# Patient Record
Sex: Male | Born: 1990 | Race: White | Hispanic: No | Marital: Single | Smoking: Current some day smoker
Health system: Southern US, Community
[De-identification: ages and names within clinical notes are randomized; demographics above are authoritative.]

## PROBLEM LIST (undated history)

## (undated) DIAGNOSIS — J45909 Unspecified asthma, uncomplicated: Secondary | ICD-10-CM

## (undated) DIAGNOSIS — G8929 Other chronic pain: Secondary | ICD-10-CM

## (undated) DIAGNOSIS — J309 Allergic rhinitis, unspecified: Secondary | ICD-10-CM

## (undated) DIAGNOSIS — G43909 Migraine, unspecified, not intractable, without status migrainosus: Secondary | ICD-10-CM

## (undated) DIAGNOSIS — M549 Dorsalgia, unspecified: Secondary | ICD-10-CM

## (undated) HISTORY — DX: Migraine, unspecified, not intractable, without status migrainosus: G43.909

## (undated) HISTORY — DX: Other chronic pain: G89.29

## (undated) HISTORY — DX: Dorsalgia, unspecified: M54.9

## (undated) HISTORY — DX: Unspecified asthma, uncomplicated: J45.909

## (undated) HISTORY — PX: TONSILLECTOMY: SHX5217

## (undated) HISTORY — DX: Allergic rhinitis, unspecified: J30.9

---
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
## 2012-01-20 ENCOUNTER — Encounter: Payer: Self-pay | Admitting: Internal Medicine

## 2012-01-27 ENCOUNTER — Ambulatory Visit (INDEPENDENT_AMBULATORY_CARE_PROVIDER_SITE_OTHER): Payer: BC Managed Care – PPO | Admitting: Internal Medicine

## 2012-01-27 ENCOUNTER — Encounter: Payer: Self-pay | Admitting: Internal Medicine

## 2012-01-27 VITALS — BP 122/72 | HR 77 | Temp 97.6°F | Ht 70.0 in | Wt 231.0 lb

## 2012-01-27 DIAGNOSIS — E669 Obesity, unspecified: Secondary | ICD-10-CM | POA: Insufficient documentation

## 2012-01-27 DIAGNOSIS — Z0000 Encounter for general adult medical examination without abnormal findings: Secondary | ICD-10-CM

## 2012-01-27 DIAGNOSIS — E663 Overweight: Secondary | ICD-10-CM

## 2012-01-27 DIAGNOSIS — Z23 Encounter for immunization: Secondary | ICD-10-CM

## 2012-01-27 DIAGNOSIS — M5432 Sciatica, left side: Secondary | ICD-10-CM | POA: Insufficient documentation

## 2012-01-27 DIAGNOSIS — M5430 Sciatica, unspecified side: Secondary | ICD-10-CM

## 2012-01-27 MED ORDER — IBUPROFEN 200 MG PO TABS: 400.0000 mg | ORAL_TABLET | Freq: Four times a day (QID) | ORAL | Status: AC | PRN

## 2012-01-27 MED ORDER — PREDNISONE (PAK) 10 MG PO TABS: 10.0000 mg | ORAL_TABLET | ORAL | Status: AC

## 2012-01-27 MED ORDER — CYCLOBENZAPRINE HCL 5 MG PO TABS: 5.0000 mg | ORAL_TABLET | Freq: Every evening | ORAL | Status: AC | PRN

## 2012-01-27 NOTE — Assessment & Plan Note (Signed)
Education on importance of diet and exercise to general health Wt Readings from Last 3 Encounters:  01/27/12 231 lb (104.781 kg) (98.26%*)  03/18/10 160 lb 12.8 oz (72.938 kg) (74.29%*)   * Growth percentiles are based on CDC 2-20 Years data.

## 2012-01-27 NOTE — Progress Notes (Signed)
  Subjective:    Patient ID: Omar French, male    DOB: 02-16-1991, 21 y.o.   MRN: [id number]  HPI patient is here today for annual physical. Patient feels well overall.  complains of back pain Ongoing > 2 years - ?yoga injury Radiates down LLE 2-3x/week Worse with sitting position, improved when supine not improved with prn OTC ibuprofen  Not associated with fever, weakness or bowel.bladder changes  Past Medical History  Diagnosis Date  . ALLERGIC RHINITIS   . ASTHMA   . MIGRAINE HEADACHE    Family History  Problem Relation Age of Onset  . Prostate cancer Maternal Grandfather   . Diabetes Other   . Hyperlipidemia Other    History  Substance Use Topics  . Smoking status: Never Smoker   . Smokeless tobacco: Not on file   Comment: Lives with mom- Attends [employer]  . Alcohol Use: No    Review of Systems Constitutional: Negative for fever or weight change.  Respiratory: Negative for cough and shortness of breath.   Cardiovascular: Negative for chest pain or palpitations.  Gastrointestinal: Negative for abdominal pain, no bowel changes.  Musculoskeletal: Negative for gait problem or joint swelling.  Skin: Negative for rash.  Neurological: Negative for dizziness or headache.  No other specific complaints in a complete review of systems (except as listed in HPI above).     Objective:   Physical Exam BP 122/72  Pulse 77  Temp(Src) 97.6 F (36.4 C) (Temporal)  Ht 5\' 10"  (1.778 m)  Wt 231 lb (104.781 kg)  BMI 33.15 kg/m2  SpO2 98% Wt Readings from Last 3 Encounters:  01/27/12 231 lb (104.781 kg) (98.26%*)  03/18/10 160 lb 12.8 oz (72.938 kg) (74.29%*)   * Growth percentiles are based on CDC 2-20 Years data.   Constitutional: he appears well-developed and well-nourished. No distress.  HENT: Head: Normocephalic and atraumatic. Ears: B TMs ok, no erythema or effusion; Nose: Nose normal. Mouth/Throat: Oropharynx is clear and moist. No oropharyngeal exudate.  Eyes:  Conjunctivae and EOM are normal. Pupils are equal, round, and reactive to light. No scleral icterus.  Neck: Normal range of motion. Neck supple. No JVD present. No thyromegaly present.  Cardiovascular: Normal rate, regular rhythm and normal heart sounds.  No murmur heard. No BLE edema. Pulmonary/Chest: Effort normal and breath sounds normal. No respiratory distress. no wheezes.  Abdominal: Soft. Bowel sounds are normal. no distension. There is no tenderness. no masses Musculoskeletal: Back: full range of motion of thoracic and lumbar spine. Non tender to palpation. Negative straight leg raise. DTR's are symmetrically intact. Sensation intact in all dermatomes of the lower extremities. Full strength to manual muscle testing. patient is able to heel toe walk without difficulty and ambulates with non-antalgic gait. Neurological: he is alert and oriented to person, place, and time. No cranial nerve deficit. Coordination normal.  Skin: Skin is warm and dry. No rash noted. No erythema.  Psychiatric: he has a normal mood and affect. behavior is normal. Judgment and thought content normal.   No results found for this basename: WBC, HGB, HCT, PLT, GLUCOSE, CHOL, TRIG, HDL, LDLDIRECT, LDLCALC, ALT, AST, NA, K, CL, CREATININE, BUN, CO2, TSH, PSA, INR, GLUF, HGBA1C, MICROALBUR      Assessment & Plan:  CPX /v70.0 - Patient has been counseled on age-appropriate routine health concerns for screening and prevention. These are reviewed and up-to-date. Immunizations are up-to-date or declined. Labs ordered and will be reviewed.

## 2012-01-27 NOTE — Assessment & Plan Note (Signed)
Intermittent symptoms x 2 years - exam benign today tx pred pak and muscle relaxer prn Back exercised and call if worse or unimproved

## 2012-01-27 NOTE — Patient Instructions (Signed)
It was good to see you today. Health Maintenance reviewed - 2nd Gardasil given today - all recommended immunizations and age-appropriate screenings are up-to-date. Test(s) ordered today. Your results will be called to you after review (48-72hours after test completion). If any changes need to be made, you will be notified at that time. For your back, 6day pred taper and flexeril muscle relaxer at night as eeded for spasm/pain - Your prescription(s) have been submitted to your pharmacy. Please take as directed and contact our office if you believe you are having problem(s) with the medication(s). Use ibuprofen for aches, pain and fever symptoms as discussed Work on lifestyle changes as discussed (low fat, low carb, increased protein diet; improved exercise efforts; weight loss) to control sugar, blood pressure and cholesterol levels and/or reduce risk of developing other medical problems. Look into [employer]cy or other type of food journal to assist you in this process. Back Exercises Back exercises help treat and prevent back injuries. The goal is to increase your strength in your belly (abdominal) and back muscles. These exercises can also help with flexibility. Start these exercises when told by your doctor. HOME CARE Back exercises include: Pelvic Tilt.  Lie on your back with your knees bent. Tilt your pelvis until the lower part of your back is against the floor. Hold this position 5 to 10 sec. Repeat this exercise 5 to 10 times.  Knee to Chest.  Pull 1 knee up against your chest and hold for 20 to 30 seconds. Repeat this with the other knee. This may be done with the other leg straight or bent, whichever feels better. Then, pull both knees up against your chest.  Sit-Ups or Curl-Ups.  Bend your knees 90 degrees. Start with tilting your pelvis, and do a partial, slow sit-up. Only lift your upper half 30 to 45 degrees off the floor. Take at least 2 to 3 seonds for each sit-up. Do not do  sit-ups with your knees out straight. If partial sit-ups are difficult, simply do the above but with only tightening your belly (abdominal) muscles and holding it as told.  Hip-Lift.  Lie on your back with your knees flexed 90 degrees. Push down with your feet and shoulders as you raise your hips 2 inches off the floor. Hold for 10 seconds, repeat 5 to 10 times.  Back Arches.  Lie on your stomach. Prop yourself up on bent elbows. Slowly press on your hands, causing an arch in your low back. Repeat 3 to 5 times.  Shoulder-Lifts.  Lie face down with arms beside your body. Keep hips and belly pressed to floor as you slowly lift your head and shoulders off the floor.  Do not overdo your exercises. Be careful in the beginning. Exercises may cause you some mild back discomfort. If the pain lasts for more than 15 minutes, stop the exercises until you see your doctor. Improvement with exercise for back problems is slow.   Document Released: 10/08/2010 Document Revised: 08/25/2011 Document Reviewed: 10/08/2010 [facility] Patient Information 2012 [place], [place].

## 2012-02-27 ENCOUNTER — Encounter: Payer: Self-pay | Admitting: Internal Medicine

## 2012-02-27 ENCOUNTER — Ambulatory Visit (INDEPENDENT_AMBULATORY_CARE_PROVIDER_SITE_OTHER): Payer: BC Managed Care – PPO | Admitting: Internal Medicine

## 2012-02-27 ENCOUNTER — Other Ambulatory Visit (INDEPENDENT_AMBULATORY_CARE_PROVIDER_SITE_OTHER): Payer: BC Managed Care – PPO

## 2012-02-27 VITALS — BP 110/68 | HR 62 | Temp 97.3°F | Ht 71.0 in | Wt 229.0 lb

## 2012-02-27 DIAGNOSIS — N419 Inflammatory disease of prostate, unspecified: Secondary | ICD-10-CM

## 2012-02-27 DIAGNOSIS — G8929 Other chronic pain: Secondary | ICD-10-CM

## 2012-02-27 DIAGNOSIS — R109 Unspecified abdominal pain: Secondary | ICD-10-CM

## 2012-02-27 DIAGNOSIS — R300 Dysuria: Secondary | ICD-10-CM

## 2012-02-27 DIAGNOSIS — N390 Urinary tract infection, site not specified: Secondary | ICD-10-CM

## 2012-02-27 LAB — CBC WITH DIFFERENTIAL/PLATELET
Basophils Absolute: 0 10*3/uL (ref 0.0–0.1)
Eosinophils Absolute: 0.3 10*3/uL (ref 0.0–0.7)
HCT: 44.7 % (ref 39.0–52.0)
Lymphs Abs: 2.6 10*3/uL (ref 0.7–4.0)
MCHC: 35 g/dL (ref 30.0–36.0)
MCV: 91.3 fl (ref 78.0–100.0)
Monocytes Absolute: 0.6 10*3/uL (ref 0.1–1.0)
Monocytes Relative: 7.4 % (ref 3.0–12.0)
Platelets: 239 10*3/uL (ref 150.0–400.0)
RDW: 12.9 % (ref 11.5–14.6)

## 2012-02-27 LAB — POCT URINALYSIS DIPSTICK
Blood, UA: NEGATIVE
Glucose, UA: NEGATIVE
Spec Grav, UA: <=1.005
Urobilinogen, UA: 0.2

## 2012-02-27 MED ORDER — SULFAMETHOXAZOLE-TRIMETHOPRIM 800-160 MG PO TABS: 1.0000 | ORAL_TABLET | Freq: Two times a day (BID) | ORAL | Status: AC

## 2012-02-27 NOTE — Progress Notes (Signed)
  Subjective:    Patient ID: Omar French, male    DOB: 1990-10-10, 21 y.o.   MRN: [id number]  HPI complains of trouble urinating Onset 2 weeks ago associated with difficulty urinating Denies penile discharge or fever  Also continued back pain, L flank Ongoing > 2 years - ?yoga injury Radiates down LLE 2-3x/week Worse with sitting position, improved when supine not improved with prn OTC ibuprofen  Brief improved with pred while on taper  Past Medical History  Diagnosis Date  . ALLERGIC RHINITIS   . ASTHMA   . MIGRAINE HEADACHE     Review of Systems  Constitutional: Negative for fever and fatigue.  Genitourinary: Positive for urgency, frequency, flank pain (left, low grade, chronic), decreased urine volume and difficulty urinating. Negative for hematuria, discharge, penile swelling, scrotal swelling, genital sores, penile pain and testicular pain.       Objective:   Physical Exam BP 110/68  Pulse 62  Temp(Src) 97.3 F (36.3 C) (Oral)  Ht 5\' 11"  (1.803 m)  Wt 229 lb (103.874 kg)  BMI 31.94 kg/m2  SpO2 98% Wt Readings from Last 3 Encounters:  02/27/12 229 lb (103.874 kg) (98.09%*)  01/27/12 231 lb (104.781 kg) (98.26%*)  03/18/10 160 lb 12.8 oz (72.938 kg) (74.29%*)   * Growth percentiles are based on CDC 2-20 Years data.   Constitutional: appears well-developed and well-nourished. No distress. Dad at side (until exchanged for chaperone) Cardiovascular: Normal rate, regular rhythm and normal heart sounds.  No murmur heard. No BLE edema. Pulmonary/Chest: Effort normal and breath sounds normal. No respiratory distress. he has no wheezes.  Abdominal: Soft, obese. Bowel sounds are normal. he exhibits no distension. There is no reproducible tenderness. no masses or suprapubic fullness GU: chaperoned by Randa Ngo CMA - normal testes B, circumcised penis without lesion or urethral discharge - prostate boggy and tender, slightly enlarged Musculoskeletal: Back: full range of  motion of thoracic and lumbar spine. Non tender to palpation. Negative straight leg raise. DTR's are symmetrically intact. Sensation intact in all dermatomes of the lower extremities. Full strength to manual muscle testing. patient is able to heel toe walk without difficulty and ambulates with antalgic gait. Skin: Skin is warm and dry. No rash noted. No erythema.  Psychiatric:  normal mood and affect. behavior is normal. Judgment and thought content normal.   No results found for this basename: WBC, HGB, HCT, PLT, GLUCOSE, CHOL, TRIG, HDL, LDLDIRECT, LDLCALC, ALT, AST, NA, K, CL, CREATININE, BUN, CO2, TSH, PSA, INR, GLUF, HGBA1C, MICROALBUR      Assessment & Plan:  Prostatitis symptoms - notable on hx and exam Bladder spasm chronic L flank pain  Check Ucx and GC/chlam urine today rule out cystitis or epidydimitis (none on exam) Also CBC and BMet tx 2 weeks septra - erx done Pt education provided Consider check CT a/p to look for anatomic problems that might explain pain and dysuria if unimproved on antibiotics or consider uro eval if all unremarkable

## 2012-02-27 NOTE — Patient Instructions (Addendum)
It was good to see you today. Prostatitis is the likely cause of your bladder symptoms - treat with 2 weeks Septra 2x/day -  Your prescription(s) have been submitted to your pharmacy. Please take as directed and contact our office if you believe you are having problem(s) with the medication(s). Test(s) ordered today. Your results will be called to you after review (48-72hours after test completion). If any changes need to be made, you will be notified at that time. Call for recheck if symptoms unimproved, sooner if worseProstatitis Prostatitis is an inflammation (the body's way of reacting to injury and/or infection) of the prostate gland. The prostate gland is a male organ. The gland is about the size and shape of a walnut. The prostate is located just below the bladder. It produces semen, which is a fluid that helps nourish and transport sperm. Prostatitis is the most common urinary tract problem in men younger than age 80. There are 4 categories of prostatitis:  I - Acute bacterial prostatitis.   II - Chronic bacterial prostatitis.   III - Chronic prostatitis and chronic pelvic pain syndrome (CPPS).   Inflammatory.   Non inflammatory.   IV - Asymptomatic inflammatory prostatitis.  Acute and chronic bacterial prostatitis are problems with bacterial infections of the prostate. "Acute" infection is usually a one-time problem. "Chronic" bacterial prostatitis is a condition with recurrent infection. It is usually caused by the same germ(bacteria). CPPS has symptoms similar to prostate infection. However, no infection is actually found. This condition can cause problems of ongoing pain. Currently, it cannot be cured. Treatments are available and aimed at symptom control.   Asymptomatic inflammatory prostatitis has no symptoms. It is a condition where infection-fighting cells are found by chance in the urine. The diagnosis is made most often during an exam for other conditions. Other conditions  could be infertility or a high level of PSA (prostate-specific antigen) in the blood. SYMPTOMS   Symptoms can vary depending upon the type of prostatitis that exists. There can also be overlap in symptoms. This can make diagnosis difficult. Symptoms: For Acute bacterial prostatitis  Painful urination.   Fever or chills.   Muscle or joint pains.   Low back pain.   Low abdominal pain.   Inability to empty bladder completely.   Sudden urges to urinate.   Frequent urination during the day.   Difficulty starting urine stream.   Need to urinate several times at night (nocturia).   Weak urine stream.   Urethral (tube that carries urine from the bladder out of the body) discharge and dribbling after urination.  For Chronic bacterial prostatitis  Rectal pain.   Pain in the testicles, penis, or tip of the penis.   Pain in the space between the anus and scrotum (perineum).   Low back pain.   Low abdominal pain.   Problems with sexual function.   Painful ejaculation.   Bloody semen.   Inability to empty bladder completely.   Painful urination.   Sudden urges to urinate.   Frequent urination during the day.   Difficulty starting urine stream.   Need to urinate several times at night (nocturia).   Weak urine stream.   Dribbling after urination.   Urethral discharge.  For Chronic prostatitis and chronic pelvic pain syndrome (CPPS) Symptoms are the same as those for chronic bacterial prostatitis. Problems with sexual function are often the reason for seeking care. This important problem should be discussed with your caregiver. For Asymptomatic inflammatory prostatitis As noted above,  there are no symptoms with this condition. DIAGNOSIS    Your caregiver may perform a rectal exam. This exam is to determine if the prostate is swollen and tender.   Sometimes blood work is performed. This is done to see if your white blood cell count is elevated. The Prostate  Specific Antigen (PSA) is also measured. PSA is a blood test that can help detect early prostate cancer.   A urinalysis is done to find out what type of infection is present if this is a suspected cause. An additional urinalysis may be done after a digital rectal exam. This is to see if white blood cells are pushed out of the prostate and into the urine. A low-grade infection of the prostate may not be found on the first urinalysis.  In more difficult cases, your caregiver may advise other tests. Tests could include:  Urodynamics -- Tests the function of the bladder and the organs involved in triggering and controlling normal urination.   Urine flow rate.   Cystoscopy -- In this procedure, a thin, telescope-like tube with a light and tiny camera attached (cystoscope) is inserted into the bladder through the urethra. This allows the caregiver to see the inside of the urethra and bladder.   Electromyography -- This procedure tests how the muscles and nerves of the bladder work. It is focused on the muscles that control the anus and pelvic floor. These are the muscles between the anus and scrotum.  In people who show no signs of infection, certain uncommon infections might be causing constant or recurrent symptoms. These uncommon infections are difficult to detect. More work in medicine may help find solutions to these problems. TREATMENT   Antibiotics are used to treat infections caused by germs. If the infection is not treated and becomes long lasting (chronic), it may become a lower grade infection with minor, continual problems. Without treatment, the prostate may develop a boil or furuncle (abscess). This may require surgical treatment. For those with chronic prostatitis and CPPS, it is important to work closely with your primary caregiver and urologist. For some, the medicines that are used to treat a non-cancerous, enlarged prostate (benign prostatic hypertrophy) may be helpful. Referrals to  specialists other than urologists may be necessary. In rare cases when all treatments have been inadequate for pain control, an operation to remove the prostate may be recommended. This is very rare and before this is considered thorough discussion with your urologist is highly recommended.   In cases of secondary to chronic non-bacterial prostatitis, a good relationship with your urologist or primary caregiver is essential because it is often a recurrent prolonged condition that requires a good understanding of the causes and a commitment to therapy aimed at controlling your symptoms. HOME CARE INSTRUCTIONS    Hot sitz baths for 20 minutes, 4 times per day, may help relieve pain.   Non-prescription pain killers may be used as your caregiver recommends if you have no allergies to them. Some illnesses or conditions prevent use of non-prescription drugs. If unsure, check with your caregiver. Take all medications as directed. Take the antibiotics for the prescribed length of time, even if you are feeling better.  SEEK MEDICAL CARE IF:    You have any worsening of the symptoms that originally brought you to your caregiver.   You have an oral temperature above 102 F (38.9 C).   You experience any side effects from medications prescribed.  SEEK IMMEDIATE MEDICAL CARE IF:    You have  an oral temperature above 102 F (38.9 C), not controlled by medicine.   You have pain not relieved with medications.   You develop nausea, vomiting, lightheadedness, or have a fainting episode.   You are unable to urinate.   You pass bloody urine or clots.  Document Released: 09/02/2000 Document Revised: 08/25/2011 Document Reviewed: 08/08/2011 [facility] Patient Information 2012 [place], [place].

## 2012-02-28 LAB — BASIC METABOLIC PANEL
BUN: 9 mg/dL (ref 6–23)
CO2: 27 mEq/L (ref 19–32)
GFR: 136.11 mL/min (ref >60.00)
Glucose, Bld: 93 mg/dL (ref 70–99)
Potassium: 4.3 mEq/L (ref 3.5–5.1)

## 2012-02-28 LAB — GC PROBE AMPLIFICATION, URINE: GC Probe Amp, Urine: NEGATIVE

## 2012-03-01 ENCOUNTER — Encounter: Payer: Self-pay | Admitting: *Deleted

## 2012-04-17 ENCOUNTER — Ambulatory Visit (INDEPENDENT_AMBULATORY_CARE_PROVIDER_SITE_OTHER): Payer: BC Managed Care – PPO

## 2012-04-17 DIAGNOSIS — Z23 Encounter for immunization: Secondary | ICD-10-CM

## 2012-10-11 ENCOUNTER — Ambulatory Visit (INDEPENDENT_AMBULATORY_CARE_PROVIDER_SITE_OTHER)
Admission: RE | Admit: 2012-10-11 | Discharge: 2012-10-11 | Disposition: A | Discharge status: Home / Self Care | Payer: 59 | Source: Ambulatory Visit | Attending: Internal Medicine | Admitting: Internal Medicine

## 2012-10-11 ENCOUNTER — Other Ambulatory Visit (INDEPENDENT_AMBULATORY_CARE_PROVIDER_SITE_OTHER): Payer: 59

## 2012-10-11 ENCOUNTER — Encounter: Payer: Self-pay | Admitting: *Deleted

## 2012-10-11 ENCOUNTER — Ambulatory Visit (INDEPENDENT_AMBULATORY_CARE_PROVIDER_SITE_OTHER): Payer: 59 | Admitting: Internal Medicine

## 2012-10-11 ENCOUNTER — Encounter: Payer: Self-pay | Admitting: Internal Medicine

## 2012-10-11 VITALS — BP 112/80 | HR 73 | Temp 97.6°F | Ht 71.0 in | Wt 207.8 lb

## 2012-10-11 DIAGNOSIS — M545 Low back pain, unspecified: Secondary | ICD-10-CM

## 2012-10-11 DIAGNOSIS — R3911 Hesitancy of micturition: Secondary | ICD-10-CM

## 2012-10-11 LAB — URINALYSIS
Ketones, ur: NEGATIVE
Specific Gravity, Urine: 1.015 (ref 1.000–1.030)
Urine Glucose: NEGATIVE
Urobilinogen, UA: 1 (ref 0.0–1.0)
pH: 8 (ref 5.0–8.0)

## 2012-10-11 MED ORDER — METHYLPREDNISOLONE ACETATE 80 MG/ML IJ SUSP
80.0000 mg | Freq: Once | INTRAMUSCULAR | Status: AC
  Administered 2012-10-11: 80 mg via INTRAMUSCULAR

## 2012-10-11 NOTE — Patient Instructions (Addendum)

## 2012-10-12 ENCOUNTER — Other Ambulatory Visit: Payer: Self-pay | Admitting: Internal Medicine

## 2012-10-12 ENCOUNTER — Encounter: Payer: Self-pay | Admitting: Internal Medicine

## 2012-10-12 ENCOUNTER — Telehealth: Payer: Self-pay | Admitting: Internal Medicine

## 2012-10-12 DIAGNOSIS — M545 Low back pain: Secondary | ICD-10-CM

## 2012-10-12 DIAGNOSIS — M5432 Sciatica, left side: Secondary | ICD-10-CM

## 2012-10-12 LAB — GC/CHLAMYDIA PROBE AMP, URINE
Chlamydia, Swab/Urine, PCR: NEGATIVE
GC Probe Amp, Urine: NEGATIVE

## 2012-10-12 NOTE — Progress Notes (Signed)
Subjective:    Patient ID: Omar French, male    DOB: 09-24-1990, 22 y.o.   MRN: [id number]  HPI  Pt presents to the clinic today with c/o back pain. This is an ongoing problem for him. He did have a back injury a few years ago and since that time has c/o intermittent lower back pain with sciatica into the left leg. He has been on pred tapers in the past which has helped his back pain but as soon as he comes of the medication, the pain returns. Additionally, he does have some concerns about an abnormal feeling in his abdomen. It feels full like he needs to urinate but it unable to go. He has had problems with prostatitis in the past but he states this does not feel the same. He has no pain with urination or burning sensation. He denies penile discharge and he has no concerns about STD's. He denies fever, chills or body aches.  Review of Systems      Past Medical History  Diagnosis Date  . ALLERGIC RHINITIS   . ASTHMA   . MIGRAINE HEADACHE     Current Outpatient Prescriptions  Medication Sig Dispense Refill  . albuterol (PROAIR HFA) 108 (90 BASE) MCG/ACT inhaler Inhale 2 puffs into the lungs every 4 (four) hours as needed.      . cetirizine (ZYRTEC) 10 MG tablet Take 10 mg by mouth daily.        Allergies  Allergen Reactions  . Amoxicillin     REACTION: Rash, Itching  . Penicillins     Family History  Problem Relation Age of Onset  . Prostate cancer Maternal Grandfather   . Diabetes Other   . Hyperlipidemia Other     History   Social History  . Marital Status: Single    Spouse Name: N/A    Number of Children: N/A  . Years of Education: N/A   Occupational History  . Not on file.   Social History Main Topics  . Smoking status: Current Some Day Smoker  . Smokeless tobacco: Not on file     Comment: Lives with mom- Attends [employer]  . Alcohol Use: No  . Drug Use: No  . Sexually Active: Not Currently     Comment: Gay, not currently in relationship- parents  are aware of pt relationships   Other Topics Concern  . Not on file   Social History Narrative  . No narrative on file     Constitutional: Denies fever, malaise, fatigue, headache or abrupt weight changes.  Gastrointestinal: Denies abdominal pain, bloating, constipation, diarrhea or blood in the stool.  GU: Pt reports hesitancy. Denies urgency, frequency, pain with urination, burning sensation, blood in urine, odor or discharge. Musculoskeletal: Pt reports back pain. Denies decrease in range of motion, difficulty with gait, or joint pain and swelling.  Skin: Denies redness, rashes, lesions or ulcercations.  Neurological: Pt reports sharp shooting pains down into his left leg. Denies dizziness, difficulty with memory, difficulty with speech or problems with balance and coordination.   No other specific complaints in a complete review of systems (except as listed in HPI above).  Objective:   Physical Exam  BP 112/80  Pulse 73  Temp 97.6 F (36.4 C) (Oral)  Ht 5\' 11"  (1.803 m)  Wt 207 lb 12 oz (94.235 kg)  BMI 28.98 kg/m2  SpO2 97% Wt Readings from Last 3 Encounters:  10/11/12 207 lb 12 oz (94.235 kg) (94.50%*)  02/27/12 229 lb (  103.874 kg) (98.09%*)  01/27/12 231 lb (104.781 kg) (98.26%*)   * Growth percentiles are based on CDC 2-20 Years data.    General: Appears his stated age, well developed, well nourished in NAD. Cardiovascular: Normal rate and rhythm. S1,S2 noted.  No murmur, rubs or gallops noted. No JVD or BLE edema. No carotid bruits noted. Pulmonary/Chest: Normal effort and positive vesicular breath sounds. No respiratory distress. No wheezes, rales or ronchi noted.  Abdomen: Soft and nontender. Normal bowel sounds, no bruits noted. No distention or masses noted. Liver, spleen and kidneys non palpable. Musculoskeletal: Positive straight leg raise. No signs of joint swelling. No difficulty with gait.  Neurological: Alert and oriented. Cranial nerves II-XII intact.  Coordination normal. +DTRs bilaterally.        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

## 2012-10-12 NOTE — Telephone Encounter (Signed)
Pt's mother informed of results of xray.

## 2012-10-12 NOTE — Telephone Encounter (Signed)
Patients mother left message on triage requesting a call from the assistant to discuss the patients x-ray results, the patient was called with the results but he does not understand them

## 2012-10-13 LAB — URINE CULTURE: Colony Count: NO GROWTH

## 2012-10-14 ENCOUNTER — Encounter (HOSPITAL_COMMUNITY): Payer: Self-pay | Admitting: *Deleted

## 2012-10-14 ENCOUNTER — Emergency Department (HOSPITAL_COMMUNITY)
Admission: EM | Admit: 2012-10-14 | Discharge: 2012-10-14 | Disposition: A | Discharge status: Home / Self Care | Payer: 59 | Attending: Emergency Medicine | Admitting: Emergency Medicine

## 2012-10-14 DIAGNOSIS — F17200 Nicotine dependence, unspecified, uncomplicated: Secondary | ICD-10-CM | POA: Insufficient documentation

## 2012-10-14 DIAGNOSIS — J309 Allergic rhinitis, unspecified: Secondary | ICD-10-CM | POA: Insufficient documentation

## 2012-10-14 DIAGNOSIS — J45909 Unspecified asthma, uncomplicated: Secondary | ICD-10-CM | POA: Insufficient documentation

## 2012-10-14 DIAGNOSIS — Z8679 Personal history of other diseases of the circulatory system: Secondary | ICD-10-CM | POA: Insufficient documentation

## 2012-10-14 DIAGNOSIS — R350 Frequency of micturition: Secondary | ICD-10-CM | POA: Insufficient documentation

## 2012-10-14 DIAGNOSIS — Z79899 Other long term (current) drug therapy: Secondary | ICD-10-CM | POA: Insufficient documentation

## 2012-10-14 DIAGNOSIS — K5900 Constipation, unspecified: Secondary | ICD-10-CM | POA: Insufficient documentation

## 2012-10-14 DIAGNOSIS — M549 Dorsalgia, unspecified: Secondary | ICD-10-CM | POA: Insufficient documentation

## 2012-10-14 DIAGNOSIS — Z8739 Personal history of other diseases of the musculoskeletal system and connective tissue: Secondary | ICD-10-CM | POA: Insufficient documentation

## 2012-10-14 LAB — URINALYSIS, ROUTINE W REFLEX MICROSCOPIC
Glucose, UA: NEGATIVE mg/dL
Hgb urine dipstick: NEGATIVE
Leukocytes, UA: NEGATIVE
Protein, ur: NEGATIVE mg/dL
Specific Gravity, Urine: 1.022 (ref 1.005–1.030)
pH: 7 (ref 5.0–8.0)

## 2012-10-14 MED ORDER — POLYETHYLENE GLYCOL 3350 17 GM/SCOOP PO POWD: 17.0000 g | Freq: Two times a day (BID) | ORAL | Status: DC

## 2012-10-14 MED ORDER — DIAZEPAM 5 MG PO TABS: 5.0000 mg | ORAL_TABLET | Freq: Two times a day (BID) | ORAL | Status: DC

## 2012-10-14 MED ORDER — TRAMADOL HCL 50 MG PO TABS
50.0000 mg | ORAL_TABLET | Freq: Once | ORAL | Status: AC
  Administered 2012-10-14: 50 mg via ORAL
  Filled 2012-10-14: qty 1

## 2012-10-14 MED ORDER — TRAMADOL HCL 50 MG PO TABS: 50.0000 mg | ORAL_TABLET | Freq: Four times a day (QID) | ORAL | Status: DC | PRN

## 2012-10-14 MED ORDER — DIAZEPAM 5 MG PO TABS
5.0000 mg | ORAL_TABLET | Freq: Once | ORAL | Status: AC
  Administered 2012-10-14: 5 mg via ORAL
  Filled 2012-10-14: qty 1

## 2012-10-14 NOTE — ED Provider Notes (Signed)
History  This chart was scribed for non-physician practitioner working with Omar Lennert, MD by Ardeen Jourdain, ED Scribe. This patient was seen in room TR08C/TR08C and the patient's care was started at 1845.  CSN: [id number]  Arrival date & time 10/14/12  1615   None     Chief Complaint  Patient presents with  . Urinary Frequency     The history is provided by the patient. No language interpreter was used.    Omar French is a 22 y.o. male who presents to the Emergency Department complaining of urinary frequency and bladder discomfort that began this morning and has remained constant. He reports he has urinated at least 15 times today. He states he was seen by his PCP Thursday for back pain and was diagnosed with spinal stenosis. He denies any increase in caffeine intake or any changes in his level of thirst. He also denies any bladder incontinence, bowel incontinence and severe back pain at this time. He is also c/o intermittent pain to his bilateral legs and constipation. He states he thinks the leg pain is associated with his back pain. He reports having issues with constipation since he was young. He denies taking anything for the pain.    Past Medical History  Diagnosis Date  . ALLERGIC RHINITIS   . ASTHMA   . MIGRAINE HEADACHE   . Spinal stenosis     Past Surgical History  Procedure Date  . Tonsillectomy     Family History  Problem Relation Age of Onset  . Prostate cancer Maternal Grandfather   . Diabetes Other   . Hyperlipidemia Other     History  Substance Use Topics  . Smoking status: Current Some Day Smoker  . Smokeless tobacco: Not on file     Comment: Lives with mom- Attends [employer]  . Alcohol Use: No      Review of Systems  Gastrointestinal: Positive for constipation.  Genitourinary: Positive for frequency.  All other systems reviewed and are negative.    Allergies  Amoxicillin and Penicillins  Home Medications   Current  Outpatient Rx  Name  Route  Sig  Dispense  Refill  . ALBUTEROL SULFATE HFA 108 (90 BASE) MCG/ACT IN AERS   Inhalation   Inhale 2 puffs into the lungs every 4 (four) hours as needed. For shortness of breath         . CETIRIZINE HCL 10 MG PO TABS   Oral   Take 10 mg by mouth daily.           Triage Vitals: BP 166/86  Pulse 71  Temp 98.2 F (36.8 C) (Oral)  Resp 18  SpO2 100%  Physical Exam  Nursing note and vitals reviewed. Constitutional: He is oriented to person, place, and time. He appears well-developed and well-nourished. No distress.  HENT:  Head: Normocephalic and atraumatic.  Eyes: EOM are normal. Pupils are equal, round, and reactive to light.  Neck: Normal range of motion. Neck supple. No tracheal deviation present.  Cardiovascular: Normal rate, regular rhythm and normal heart sounds.  Exam reveals no gallop and no friction rub.   No murmur heard. Pulmonary/Chest: Effort normal and breath sounds normal. No respiratory distress. He has no wheezes. He has no rales. He exhibits no tenderness.  Abdominal: Soft. Bowel sounds are normal. He exhibits no distension and no mass. There is tenderness. There is no rebound and no guarding.       Mild suprapubic and LLQ TTP  Musculoskeletal:  Normal range of motion. He exhibits no edema.  Neurological: He is alert and oriented to person, place, and time.  Skin: Skin is warm and dry.  Psychiatric: He has a normal mood and affect. His behavior is normal.    ED Course  Procedures (including critical care time)  DIAGNOSTIC STUDIES: Oxygen Saturation is 100% on room air, normal by my interpretation.    COORDINATION OF CARE:  7:12 PM: Discussed treatment plan which includes pain medication and Miralax  with pt at bedside and pt agreed to plan.    Results for orders placed during the hospital encounter of 10/14/12  URINALYSIS, ROUTINE W REFLEX MICROSCOPIC      Component Value Range   Color, Urine YELLOW  YELLOW   APPearance  CLEAR  CLEAR   Specific Gravity, Urine 1.022  1.005 - 1.030   pH 7.0  5.0 - 8.0   Glucose, UA NEGATIVE  NEGATIVE mg/dL   Hgb urine dipstick NEGATIVE  NEGATIVE   Bilirubin Urine NEGATIVE  NEGATIVE   Ketones, ur NEGATIVE  NEGATIVE mg/dL   Protein, ur NEGATIVE  NEGATIVE mg/dL   Urobilinogen, UA 0.2  0.0 - 1.0 mg/dL   Nitrite NEGATIVE  NEGATIVE   Leukocytes, UA NEGATIVE  NEGATIVE    No results found.   1. Increased urinary frequency   2. Back pain       MDM  Urinalysis unremarkable. Patient will have Miralax for possible constipation causing urinary frequency. I will prescribe valium and tramadol for back pain. No bladder/bowel incontinence or saddle paresthesias. Patient will follow up with PCP and urology for further evaluation.       I personally performed the services described in this documentation, which was scribed in my presence. The recorded information has been reviewed and is accurate.    Emilia Beck, PA-C 10/16/12 1601

## 2012-10-14 NOTE — ED Notes (Signed)
Pt reports having bladder discomfort and urinary frequency for several days. Pt is worried that its related to his spinal stenosis.

## 2012-10-14 NOTE — ED Notes (Signed)
Pt presents to department for evaluation of urinary frequency. States he was seen at PCP earlier this week for lower back pain radiating to L leg and LLQ abdominal pain. Now states urinary frequency and bilateral leg soreness. Denies hematuria. Denies abdominal pain at the time. Abdomen soft and nontender to palpation. Denies pain at the time. No nausea/vomiting.

## 2012-10-15 ENCOUNTER — Telehealth: Payer: Self-pay | Admitting: Internal Medicine

## 2012-10-15 NOTE — Telephone Encounter (Signed)
[facility] Triage Call Report Triage Record Num: [id number] Operator: Roselyn Meier Patient Name: Omar French Call Date & Time: 10/14/2012 3:59:14PM Patient Phone: [phone] PCP: Rene Paci Patient Gender: Male PCP Fax : [phone] Patient DOB: 02-01-1991 [facility] Name[facility] Reason for Call: Caller: Cena Benton; PCP: Rene Paci (Adults only); CB#: [phone]; Call regarding Back Pain; Pt was seen in the office on 10/11/12. Pt was diagnosed with spinal stenosis. Pt reports now he is having urniary leakage and urinary frequency. Emergent symptom of 'New onset or unexplained change in bowel or bladder control' identified in the Back Symptoms Protocol. RN advised for pt to be seen in the ER. Pt verbalized understanding and states he will go to [facility] for eval/treatment. Protocol(s) Used: Back Symptoms Recommended Outcome per Protocol: See ED Immediately Reason for Outcome: New onset or unexplained change in bowel or bladder control (unable to urinate and full feeling or loss of control of bowel or bladder)

## 2012-10-16 NOTE — ED Provider Notes (Signed)
Medical screening examination/treatment/procedure(s) were performed by non-physician practitioner and as supervising physician I was immediately available for consultation/collaboration.   [name], MD 10/16/12 [phone]

## 2013-01-03 ENCOUNTER — Ambulatory Visit (INDEPENDENT_AMBULATORY_CARE_PROVIDER_SITE_OTHER): Payer: 59 | Admitting: Internal Medicine

## 2013-01-03 ENCOUNTER — Encounter: Payer: Self-pay | Admitting: Internal Medicine

## 2013-01-03 VITALS — BP 110/80 | HR 65 | Temp 98.2°F | Wt 206.8 lb

## 2013-01-03 DIAGNOSIS — M5432 Sciatica, left side: Secondary | ICD-10-CM

## 2013-01-03 DIAGNOSIS — J309 Allergic rhinitis, unspecified: Secondary | ICD-10-CM

## 2013-01-03 DIAGNOSIS — M5430 Sciatica, unspecified side: Secondary | ICD-10-CM

## 2013-01-03 MED ORDER — CETIRIZINE HCL 10 MG PO TABS: 10.0000 mg | ORAL_TABLET | Freq: Every day | ORAL | Status: DC

## 2013-01-03 MED ORDER — TRAMADOL HCL 50 MG PO TABS: 50.0000 mg | ORAL_TABLET | Freq: Three times a day (TID) | ORAL | Status: DC | PRN

## 2013-01-03 MED ORDER — FLUTICASONE PROPIONATE 50 MCG/ACT NA SUSP: 2.0000 | Freq: Every day | NASAL | Status: DC

## 2013-01-03 MED ORDER — AZELASTINE HCL 0.05 % OP SOLN: 1.0000 [drp] | Freq: Two times a day (BID) | OPHTHALMIC | Status: AC

## 2013-01-03 MED ORDER — ALBUTEROL SULFATE HFA 108 (90 BASE) MCG/ACT IN AERS: 2.0000 | INHALATION_SPRAY | RESPIRATORY_TRACT | Status: AC | PRN

## 2013-01-03 MED ORDER — MONTELUKAST SODIUM 10 MG PO TABS: 10.0000 mg | ORAL_TABLET | Freq: Every day | ORAL | Status: DC

## 2013-01-03 MED ORDER — PREDNISONE (PAK) 10 MG PO TABS: 10.0000 mg | ORAL_TABLET | ORAL | Status: DC

## 2013-01-03 NOTE — Patient Instructions (Signed)
It was good to see you today. We have reviewed your prior records including labs and tests today For allergies: continue Zyrtec daily. Add Singulair daily and use Flonase nose spray daily. Optivar eyedrops as needed, albuterol inhaler as needed. For your back, okay to use tramadol as needed. Refill provided If severe allergy symptoms or flare of back pain during travel, okay to use prednisone Pak as discussed Your prescription(s) have been submitted to your pharmacy. Please take as directed and contact our office if you believe you are having problem(s) with the medication(s). Will refer to allergist for consideration of shots if needed, also to back specialist for review -my office will call regarding these appointments.

## 2013-01-03 NOTE — Progress Notes (Signed)
  Subjective:    Patient ID: Omar French, male    DOB: 04-23-1991, 22 y.o.   MRN: [id number]  HPI  complains of severe allergy symptoms: sneezing, nasal congestion, runny nose, itchy eyes, scratchy throat Denies fever or discolored discharge Unimproved with when necessary Zyrtec use Concerned with upcoming international travel x1 month to [place]  Also continued back pain, L flank Ongoing > 2 years - ?yoga injury Radiates down LLE 2-3x/week Worse with sitting position, improved when supine not improved with prn OTC ibuprofen -much improved with tramadol as needed Brief improved with pred while on taper  Past Medical History  Diagnosis Date  . ALLERGIC RHINITIS   . ASTHMA   . MIGRAINE HEADACHE   . Spinal stenosis     Review of Systems  Constitutional: Negative for fever and fatigue.  Genitourinary: Positive for flank pain (left, low grade, chronic).  Musculoskeletal: Positive for back pain. Negative for myalgias, joint swelling and arthralgias.       Objective:   Physical Exam  BP 110/80  Pulse 65  Temp(Src) 98.2 F (36.8 C) (Oral)  Wt 206 lb 12.8 oz (93.804 kg)  BMI 28.86 kg/m2  SpO2 98% Wt Readings from Last 3 Encounters:  01/03/13 206 lb 12.8 oz (93.804 kg) (94%*, Z = 1.55)  10/11/12 207 lb 12 oz (94.235 kg) (95%*, Z = 1.60)  02/27/12 229 lb (103.874 kg) (98%*, Z = 2.07)   * Growth percentiles are based on CDC 2-20 Years data.   Constitutional: he is overweight, appears well-developed and well-nourished. No distress.  HENT: Head: Normocephalic and atraumatic. Sinuses nontender. Ears: B TMs ok, no erythema or effusion; Nose: clear rhinorrhea discharge, pale turbinates, no erythema, ulceration or polyp. Mouth/Throat: Oropharynx is clear and moist. No oropharyngeal exudate.  Eyes: Conjunctivae and EOM are normal. Pupils are equal, round, and reactive to light. No scleral icterus.  Neck: Normal range of motion. Neck supple. No JVD or LAD present. No thyromegaly  present.  Cardiovascular: Normal rate, regular rhythm and normal heart sounds.  No murmur heard. No BLE edema. Pulmonary/Chest: Effort normal and breath sounds normal. No respiratory distress. he has no wheezes. Musculoskeletal:Back: full range of motion of thoracic and lumbar spine. Non tender to palpation. Negative straight leg raise. DTR's are symmetrically intact. Sensation intact in all dermatomes of the lower extremities. Full strength to manual muscle testing. patient is able to heel toe walk without difficulty and ambulates with antalgic gait. Skin: Skin is warm and dry. No rash noted. No erythema.  Psychiatric: he has a normal mood and affect. His behavior is normal. Judgment and thought content normal.    Lab Results  Component Value Date   WBC 8.1 02/27/2012   HGB 15.6 02/27/2012   HCT 44.7 02/27/2012   PLT 239.0 02/27/2012   GLUCOSE 93 02/27/2012   NA 142 02/27/2012   K 4.3 02/27/2012   CL 108 02/27/2012   CREATININE 0.8 02/27/2012   BUN 9 02/27/2012   CO2 27 02/27/2012       Assessment & Plan:   See problem list. Medications and labs reviewed today.

## 2013-01-03 NOTE — Assessment & Plan Note (Signed)
Intermittent symptoms x 2 years - exam benign today Much improved with tramadol when needed Prior imaging showed "congenital stenosis" Will refer to back specialist for evaluation and review, especially for prognostic indication and tx

## 2013-01-03 NOTE — Assessment & Plan Note (Signed)
Prescribe prednisone pack to carry for severe symptoms, especially during upcoming international travel Continue Zyrtec once daily and begin Singulair once daily Add nasal steroid qd and allergy eyedrops prn Refer to allergist to consider allergy shots if needed Refill albuterol inhaler prn use

## 2013-01-29 ENCOUNTER — Other Ambulatory Visit: Payer: Self-pay | Admitting: Internal Medicine

## 2013-01-31 ENCOUNTER — Encounter: Payer: BC Managed Care – PPO | Admitting: Internal Medicine

## 2013-02-22 ENCOUNTER — Institutional Professional Consult (permissible substitution): Payer: 59 | Admitting: Internal Medicine

## 2013-04-08 ENCOUNTER — Ambulatory Visit (INDEPENDENT_AMBULATORY_CARE_PROVIDER_SITE_OTHER): Payer: 59 | Admitting: Internal Medicine

## 2013-04-08 ENCOUNTER — Encounter: Payer: Self-pay | Admitting: Internal Medicine

## 2013-04-08 ENCOUNTER — Other Ambulatory Visit (INDEPENDENT_AMBULATORY_CARE_PROVIDER_SITE_OTHER): Payer: 59

## 2013-04-08 VITALS — BP 120/82 | HR 73 | Temp 98.4°F | Wt 207.2 lb

## 2013-04-08 DIAGNOSIS — Z23 Encounter for immunization: Secondary | ICD-10-CM

## 2013-04-08 DIAGNOSIS — Z0000 Encounter for general adult medical examination without abnormal findings: Secondary | ICD-10-CM

## 2013-04-08 DIAGNOSIS — E663 Overweight: Secondary | ICD-10-CM

## 2013-04-08 LAB — LIPID PANEL
Cholesterol: 197 mg/dL (ref 0–200)
HDL: 35.7 mg/dL — ABNORMAL LOW (ref >39.00)
Triglycerides: 156 mg/dL — ABNORMAL HIGH (ref 0.0–149.0)
VLDL: 31.2 mg/dL (ref 0.0–40.0)

## 2013-04-08 LAB — HEPATIC FUNCTION PANEL
ALT: 43 U/L (ref 0–53)
AST: 25 U/L (ref 0–37)
Total Bilirubin: 0.9 mg/dL (ref 0.3–1.2)
Total Protein: 7.4 g/dL (ref 6.0–8.3)

## 2013-04-08 LAB — CBC WITH DIFFERENTIAL/PLATELET
Basophils Absolute: 0 10*3/uL (ref 0.0–0.1)
Eosinophils Relative: 4.7 % (ref 0.0–5.0)
Hemoglobin: 16.5 g/dL (ref 13.0–17.0)
Lymphocytes Relative: 34.1 % (ref 12.0–46.0)
Monocytes Relative: 8.3 % (ref 3.0–12.0)
Platelets: 232 10*3/uL (ref 150.0–400.0)
RDW: 12.2 % (ref 11.5–14.6)
WBC: 6 10*3/uL (ref 4.5–10.5)

## 2013-04-08 LAB — BASIC METABOLIC PANEL
BUN: 11 mg/dL (ref 6–23)
Calcium: 10 mg/dL (ref 8.4–10.5)
GFR: 127.01 mL/min (ref >60.00)
Glucose, Bld: 93 mg/dL (ref 70–99)
Sodium: 139 mEq/L (ref 135–145)

## 2013-04-08 LAB — TSH: TSH: 3.08 u[IU]/mL (ref 0.35–5.50)

## 2013-04-08 NOTE — Progress Notes (Signed)
Subjective:    Patient ID: Omar French, male    DOB: [birth year], 22 y.o.   MRN: [id number]  HPI  patient is here today for annual physical. Patient feels well and has no compliants  Past Medical History  Diagnosis Date  . ALLERGIC RHINITIS   . ASTHMA   . MIGRAINE HEADACHE   . Spinal stenosis    Family History  Problem Relation Age of Onset  . Prostate cancer Maternal Grandfather   . Diabetes Other   . Hyperlipidemia Other    History  Substance Use Topics  . Smoking status: Current Some Day Smoker  . Smokeless tobacco: Not on file  . Alcohol Use: No    Review of Systems  Constitutional: Negative for fever or weight change.  Respiratory: Negative for cough and shortness of breath.   Cardiovascular: Negative for chest pain or palpitations.  Gastrointestinal: Negative for abdominal pain, no bowel changes.  Musculoskeletal: Negative for gait problem or joint swelling.  Skin: Negative for rash.  Neurological: Negative for dizziness or headache.  No other specific complaints in a complete review of systems (except as listed in HPI above).     Objective:   Physical Exam  BP 120/82  Pulse 73  Temp(Src) 98.4 F (36.9 C) (Oral)  Wt 207 lb 3.2 oz (93.985 kg)  BMI 28.91 kg/m2  SpO2 98% Wt Readings from Last 3 Encounters:  04/08/13 207 lb 3.2 oz (93.985 kg)  01/03/13 206 lb 12.8 oz (93.804 kg) (94%*, Z = 1.55)  10/11/12 207 lb 12 oz (94.235 kg) (95%*, Z = 1.60)   * Growth percentiles are based on CDC 2-20 Years data.   Constitutional: he is overweight, but appears well-developed and well-nourished. No distress.  HENT: Head: Normocephalic and atraumatic. Ears: B TMs ok, no erythema or effusion; Nose: Nose normal. Mouth/Throat: Oropharynx is clear and moist. No oropharyngeal exudate.  Eyes: Conjunctivae and EOM are normal. Pupils are equal, round, and reactive to light. No scleral icterus.  Neck: Normal range of motion. Neck supple. No JVD present. No thyromegaly  present.  Cardiovascular: Normal rate, regular rhythm and normal heart sounds.  No murmur heard. No BLE edema. Pulmonary/Chest: Effort normal and breath sounds normal. No respiratory distress. no wheezes.  Abdominal: Soft. Bowel sounds are normal. no distension. There is no tenderness. no masses Musculoskeletal: Back: full range of motion of thoracic and lumbar spine. Non tender to palpation. Negative straight leg raise. DTR's are symmetrically intact. Sensation intact in all dermatomes of the lower extremities. Full strength to manual muscle testing. patient is able to heel toe walk without difficulty and ambulates with non-antalgic gait. Neurological: he is alert and oriented to person, place, and time. No cranial nerve deficit. Coordination normal.  Skin: Skin is warm and dry. No rash noted. No erythema.  Psychiatric: he has a normal mood and affect. behavior is normal. Judgment and thought content normal.   Lab Results  Component Value Date   WBC 8.1 02/27/2012   HGB 15.6 02/27/2012   HCT 44.7 02/27/2012   PLT 239.0 02/27/2012   GLUCOSE 93 02/27/2012   NA 142 02/27/2012   K 4.3 02/27/2012   CL 108 02/27/2012   CREATININE 0.8 02/27/2012   BUN 9 02/27/2012   CO2 27 02/27/2012       Assessment & Plan:   CPX /v70.0 - Patient has been counseled on age-appropriate routine health concerns for screening and prevention. These are reviewed and up-to-date. Immunizations are up-to-date or declined. Labs ordered  and will be reviewed.

## 2013-04-08 NOTE — Patient Instructions (Signed)
It was good to see you today. Health Maintenance reviewed - 3rd and last Gardasil given today - all recommended immunizations and age-appropriate screenings are up-to-date. Test(s) ordered today. Your results will be released to MyChart (or called to you) after review, usually within 72hours after test completion. If any changes need to be made, you will be notified at that same time. Medications reviewed and updated, no changes recommended at this time. Continue to work on lifestyle changes as discussed (low fat, low carb, increased protein diet; improved exercise efforts; weight loss) to control sugar, blood pressure and cholesterol levels and/or reduce risk of developing other medical problems. Look into [email or web address] or other type of food journal to assist you in this process. Please schedule followup in 12 months for annual wellness visit and labs, call sooner if problems.  Health Maintenance, 60- to 11-Year-Old SCHOOL PERFORMANCE After high school completion, the young adult may be attending college, Scientist, product/process development or vocational school, or entering the [employer] or the work force. SOCIAL AND EMOTIONAL DEVELOPMENT The young adult establishes adult relationships and explores sexual identity. Young adults may be living at home or in a college dorm or apartment. Increasing independence is important with young adults. Throughout adolescence, teens should assume responsibility of their own health care. IMMUNIZATIONS Most young adults should be fully vaccinated. A booster dose of Tdap (tetanus, diphtheria, and pertussis, or "whooping cough"), a dose of meningococcal vaccine to protect against a certain type of bacterial meningitis, hepatitis A, human papillomarvirus (HPV), chickenpox, or measles vaccines may be indicated, if not given at an earlier age. Annual influenza or "flu" vaccination should be considered during flu season.  TESTING Annual screening for vision and hearing problems is recommended.  Vision should be screened objectively at least once between 20 and 11 years of age. The young adult may be screened for anemia or tuberculosis. Young adults should have a blood test to check for high cholesterol during this time period. Young adults should be screened for use of alcohol and drugs. If the young adult is sexually active, screening for sexually transmitted infections, pregnancy, or HIV may be performed. Screening for cervical cancer should be performed within 3 years of beginning sexual activity. NUTRITION AND ORAL HEALTH  Adequate calcium intake is important. Consume 3 servings of low-fat milk and dairy products daily. For those who do not drink milk or consume dairy products, calcium enriched foods, such as juice, bread, or cereal, dark, leafy greens, or canned fish are alternate sources of calcium.  Drink plenty of water. Limit fruit juice to 8 to 12 ounces per day. Avoid sugary beverages or sodas.  Discourage skipping meals, especially breakfast. Teens should eat a good variety of vegetables and fruits, as well as lean meats.  Avoid high fat, high salt, and high sugar foods, such as candy, chips, and cookies.  Encourage young adults to participate in meal planning and preparation.  Eat meals together as a family whenever possible. Encourage conversation at mealtime.  Limit fast food choices and eating out at restaurants.  Brush teeth twice a day and floss.  Schedule dental exams twice a year. SLEEP Regular sleep habits are important. PHYSICAL, SOCIAL, AND EMOTIONAL DEVELOPMENT  One hour of regular physical activity daily is recommended. Continue to participate in sports.  Encourage young adults to develop their own interests and consider community service or volunteerism.  Provide guidance to the young adult in making decisions about college and work plans.  Make sure that young adults know that they should  never be in a situation that makes them uncomfortable, and  they should tell partners if they do not want to engage in sexual activity.  Talk to the young adult about body image. Eating disorders may be noted at this time. Young adults may also be concerned about being overweight. Monitor the young adult for weight gain or loss.  Mood disturbances, depression, anxiety, alcoholism, or attention problems may be noted in young adults. Talk to the caregiver if there are concerns about mental illness.  Negotiate limit setting and independent decision making.  Encourage the young adult to handle conflict without physical violence.  Avoid loud noises which may impair hearing.  Limit television and computer time to 2 hours per day. Individuals who engage in excessive sedentary activity are more likely to become overweight. RISK BEHAVIORS  Sexually active young adults need to take precautions against pregnancy and sexually transmitted infections. Talk to young adults about contraception.  Provide a tobacco-free and drug-free environment for the young adult. Talk to the young adult about drug, tobacco, and alcohol use among friends or at friends' homes. Make sure the young adult knows that smoking tobacco or marijuana and taking drugs have health consequences and may impact brain development.  Teach the young adult about appropriate use of over-the-counter or prescription medicines.  Establish guidelines for driving and for riding with friends.  Talk to young adults about the risks of drinking and driving or boating. Encourage the young adult to call you if he or she or friends have been drinking or using drugs.  Remind young adults to wear seat belts at all times in cars and life vests in boats.  Young adults should always wear a properly fitted helmet when they are riding a bicycle.  Use caution with all-terrain vehicles (ATVs) or other motorized vehicles.  Do not keep handguns in the home. (If you do, the gun and ammunition should be locked separately  and out of the young adult's access.)  Equip your home with smoke detectors and change the batteries regularly. Make sure all family members know the fire escape plans for your home.  Teach young adults not to swim alone and not to dive in shallow water.  All individuals should wear sunscreen that protects against UVA and UVB light with at least a sun protection factor (SPF) of 30 when out in the sun. This minimizes sun burning. WHAT'S NEXT? Young adults should visit their pediatrician or family physician yearly. By young adulthood, health care should be transitioned to a family physician or internal medicine specialist. Sexually active females may want to begin annual physical exams with a gynecologist. Document Released: 12/01/2006 Document Revised: 11/28/2011 Document Reviewed: 12/21/2006 [facility] Patient Information 2014 [place], [place]. Health Maintenance, Males A healthy lifestyle and preventative care can promote health and wellness.  Maintain regular health, dental, and eye exams.  Eat a healthy diet. Foods like vegetables, fruits, whole grains, low-fat dairy products, and lean protein foods contain the nutrients you need without too many calories. Decrease your intake of foods high in solid fats, added sugars, and salt. Get information about a proper diet from your caregiver, if necessary.  Regular physical exercise is one of the most important things you can do for your health. Most adults should get at least 150 minutes of moderate-intensity exercise (any activity that increases your heart rate and causes you to sweat) each week. In addition, most adults need muscle-strengthening exercises on 2 or more days a week.   Maintain a healthy  weight. The body mass index (BMI) is a screening tool to identify possible weight problems. It provides an estimate of body fat based on height and weight. Your caregiver can help determine your BMI, and can help you achieve or maintain a healthy  weight. For adults 20 years and older:  A BMI below 18.5 is considered underweight.  A BMI of 18.5 to 24.9 is normal.  A BMI of 25 to 29.9 is considered overweight.  A BMI of 30 and above is considered obese.  Maintain normal blood lipids and cholesterol by exercising and minimizing your intake of saturated fat. Eat a balanced diet with plenty of fruits and vegetables. Blood tests for lipids and cholesterol should begin at age 72 and be repeated every 5 years. If your lipid or cholesterol levels are high, you are over 50, or you are a high risk for heart disease, you may need your cholesterol levels checked more frequently.Ongoing high lipid and cholesterol levels should be treated with medicines, if diet and exercise are not effective.  If you smoke, find out from your caregiver how to quit. If you do not use tobacco, do not start.  If you choose to drink alcohol, do not exceed 2 drinks per day. One drink is considered to be 12 ounces (355 mL) of beer, 5 ounces (148 mL) of wine, or 1.5 ounces (44 mL) of liquor.  Avoid use of street drugs. Do not share needles with anyone. Ask for help if you need support or instructions about stopping the use of drugs.  High blood pressure causes heart disease and increases the risk of stroke. Blood pressure should be checked at least every 1 to 2 years. Ongoing high blood pressure should be treated with medicines if weight loss and exercise are not effective.  If you are 72 to 20 years old, ask your caregiver if you should take aspirin to prevent heart disease.  Diabetes screening involves taking a blood sample to check your fasting blood sugar level. This should be done once every 3 years, after age 15, if you are within normal weight and without risk factors for diabetes. Testing should be considered at a younger age or be carried out more frequently if you are overweight and have at least 1 risk factor for diabetes.  Colorectal cancer can be detected  and often prevented. Most routine colorectal cancer screening begins at the age of 47 and continues through age 65. However, your caregiver may recommend screening at an earlier age if you have risk factors for colon cancer. On a yearly basis, your caregiver may provide home test kits to check for hidden blood in the stool. Use of a small camera at the end of a tube, to directly examine the colon (sigmoidoscopy or colonoscopy), can detect the earliest forms of colorectal cancer. Talk to your caregiver about this at age 6, when routine screening begins. Direct examination of the colon should be repeated every 5 to 10 years through age 70, unless early forms of pre-cancerous polyps or small growths are found.  Hepatitis C blood testing is recommended for all people born from [birth year] through 1963 and any individual with known risks for hepatitis C.  Healthy men should no longer receive prostate-specific antigen (PSA) blood tests as part of routine cancer screening. Consult with your caregiver about prostate cancer screening.  Testicular cancer screening is not recommended for adolescents or adult males who have no symptoms. Screening includes self-exam, caregiver exam, and other screening tests. Consult with  your caregiver about any symptoms you have or any concerns you have about testicular cancer.  Practice safe sex. Use condoms and avoid high-risk sexual practices to reduce the spread of sexually transmitted infections (STIs).  Use sunscreen with a sun protection factor (SPF) of 30 or greater. Apply sunscreen liberally and repeatedly throughout the day. You should seek shade when your shadow is shorter than you. Protect yourself by wearing long sleeves, pants, a wide-brimmed hat, and sunglasses year round, whenever you are outdoors.  Notify your caregiver of new moles or changes in moles, especially if there is a change in shape or color. Also notify your caregiver if a mole is larger than the size of a  pencil eraser.  A one-time screening for abdominal aortic aneurysm (AAA) and surgical repair of large AAAs by sound wave imaging (ultrasonography) is recommended for ages 66 to 78 years who are current or former smokers.  Stay current with your immunizations. Document Released: 03/03/2008 Document Revised: 11/28/2011 Document Reviewed: 01/31/2011 [facility] Patient Information 2014 [place], [place].

## 2013-04-08 NOTE — Assessment & Plan Note (Signed)
Education on importance of diet and exercise to general health Wt Readings from Last 3 Encounters:  04/08/13 207 lb 3.2 oz (93.985 kg)  01/03/13 206 lb 12.8 oz (93.804 kg) (94%*, Z = 1.55)  10/11/12 207 lb 12 oz (94.235 kg) (95%*, Z = 1.60)   * Growth percentiles are based on CDC 2-20 Years data.

## 2013-04-09 LAB — URINALYSIS, ROUTINE W REFLEX MICROSCOPIC
Leukocytes, UA: NEGATIVE
Nitrite: NEGATIVE
Specific Gravity, Urine: 1.02 (ref 1.000–1.030)
pH: 6.5 (ref 5.0–8.0)

## 2013-04-12 ENCOUNTER — Institutional Professional Consult (permissible substitution): Payer: 59 | Admitting: Internal Medicine

## 2013-06-07 ENCOUNTER — Other Ambulatory Visit: Payer: Self-pay | Admitting: Internal Medicine

## 2013-09-09 ENCOUNTER — Telehealth: Payer: Self-pay | Admitting: *Deleted

## 2013-09-09 MED ORDER — PAROXETINE HCL 10 MG PO TABS: 10.0000 mg | ORAL_TABLET | Freq: Every day | ORAL | Status: DC

## 2013-09-09 NOTE — Telephone Encounter (Signed)
pts mother called states at Therapist appoint today with Dr Andi Hence, Dr Andi Hence suggested pt be prescribed an anxiety medication by PCP.  Please advise

## 2013-09-09 NOTE — Telephone Encounter (Signed)
Start low dose Paxil -10mg  daily - erx done Continue counseling OV here in 4-6 weeks to review symptoms and titrate meds as needed - Call sooner if worse or other problems

## 2013-09-09 NOTE — Telephone Encounter (Signed)
Spoke with pts mother advised of MDs message 

## 2014-01-25 ENCOUNTER — Other Ambulatory Visit: Payer: Self-pay | Admitting: Internal Medicine

## 2014-01-27 ENCOUNTER — Other Ambulatory Visit: Payer: Self-pay | Admitting: Internal Medicine

## 2014-02-26 ENCOUNTER — Telehealth: Payer: Self-pay | Admitting: Internal Medicine

## 2014-02-26 ENCOUNTER — Encounter: Payer: Self-pay | Admitting: Internal Medicine

## 2014-02-26 ENCOUNTER — Ambulatory Visit (INDEPENDENT_AMBULATORY_CARE_PROVIDER_SITE_OTHER): Payer: 59 | Admitting: Internal Medicine

## 2014-02-26 VITALS — BP 120/84 | HR 86 | Temp 98.1°F | Wt 245.8 lb

## 2014-02-26 DIAGNOSIS — E663 Overweight: Secondary | ICD-10-CM

## 2014-02-26 DIAGNOSIS — F411 Generalized anxiety disorder: Secondary | ICD-10-CM | POA: Insufficient documentation

## 2014-02-26 MED ORDER — ALPRAZOLAM 0.5 MG PO TABS: 0.2500 mg | ORAL_TABLET | Freq: Two times a day (BID) | ORAL | Status: DC | PRN

## 2014-02-26 NOTE — Telephone Encounter (Signed)
Relevant patient education mailed to patient.  

## 2014-02-26 NOTE — Progress Notes (Signed)
Subjective:    Patient ID: Omar French, male    DOB: 02-21-1991, 23 y.o.   MRN: [id number]  Anxiety Onset was 1 to 6 months ago. The problem has been gradually worsening. Symptoms include decreased concentration, depressed mood, excessive worry, insomnia, irritability, malaise, nervous/anxious behavior, obsessions, panic and restlessness. Patient reports no chest pain, confusion, dizziness, dry mouth, feeling of choking, muscle tension, nausea, shortness of breath or suicidal ideas. Symptoms occur constantly. The severity of symptoms is causing significant distress. Nothing aggravates the symptoms. The quality of sleep is non-restorative. Nighttime awakenings: several.   Risk factors include illicit drug use, emotional abuse and family history. His past medical history is significant for anxiety/panic attacks and depression. There is no history of suicide attempts. Past treatments include counseling (CBT) (took Paxil x 30d end of 2014, ten stopped because of fear of potential SE). The treatment provided mild relief. Compliance with prior treatments has been poor.    Also reviewed chronic medical issues and interval medical events  Past Medical History  Diagnosis Date  . ALLERGIC RHINITIS   . ASTHMA   . MIGRAINE HEADACHE   . Chronic back pain     congenital short goals    Review of Systems  Constitutional: Positive for irritability, fatigue and unexpected weight change (continued gain). Negative for fever.  Respiratory: Negative for cough and shortness of breath.   Cardiovascular: Negative for chest pain.  Gastrointestinal: Negative for nausea.  Neurological: Negative for dizziness.  Psychiatric/Behavioral: Positive for sleep disturbance, dysphoric mood and decreased concentration. Negative for suicidal ideas, hallucinations, confusion, self-injury and agitation. The patient is nervous/anxious and has insomnia. The patient is not hyperactive.        Objective:   Physical  Exam  BP 120/84  Pulse 86  Temp(Src) 98.1 F (36.7 C) (Oral)  Wt 245 lb 12.8 oz (111.494 kg)  SpO2 97% Wt Readings from Last 3 Encounters:  02/26/14 245 lb 12.8 oz (111.494 kg)  04/08/13 207 lb 3.2 oz (93.985 kg)  01/03/13 206 lb 12.8 oz (93.804 kg) (94%*, Z = 1.55)   * Growth percentiles are based on CDC 2-20 Years data.    Constitutional: he is obese, but appears well-developed and well-nourished. No distress.  Neck: Normal range of motion. Neck supple. No JVD present. No thyromegaly present.  Cardiovascular: Normal rate, regular rhythm and normal heart sounds.  No murmur heard. No BLE edema. Pulmonary/Chest: Effort normal and breath sounds normal. No respiratory distress. he has no wheezes.  Psychiatric: he has an anxious mood and affect. His behavior is normal. Judgment and thought content normal.   Lab Results  Component Value Date   WBC 6.0 04/08/2013   HGB 16.5 04/08/2013   HCT 46.9 04/08/2013   PLT 232.0 04/08/2013   GLUCOSE 93 04/08/2013   CHOL 197 04/08/2013   TRIG 156.0* 04/08/2013   HDL 35.70* 04/08/2013   LDLCALC 130* 04/08/2013   ALT 43 04/08/2013   AST 25 04/08/2013   NA 139 04/08/2013   K 4.2 04/08/2013   CL 103 04/08/2013   CREATININE 0.8 04/08/2013   BUN 11 04/08/2013   CO2 27 04/08/2013   TSH 3.08 04/08/2013    No results found.     Assessment & Plan:   Problem List Items Addressed This Visit   Anxiety state, unspecified - Primary     Previous prescription for generic Paxil December 2014 reviewed, took same for 30 days, but self discontinued because of fear of potential side effects Overlap  with psychosocial issues and medical illness in family members Discussed potential medication options, patient declines SSRI or SSRI prescriptive therapy at this time Requests prescription for Xanax as sister takes same - discussed potential adverse reactions including habituation and risk of dependency with benzodiazepines, but small supply given for treatment of panic  attacks as needed Patient understands that if this becomes a chronic prescription, he will undergo urine drug screening to monitor for compliance Also recommend counseling for behavioral therapy and patient agrees, referral to [facility] placed today    Relevant Medications      ALPRAZolam (XANAX) tablet   Other Relevant Orders      Ambulatory referral to Psychology   Obese      Education on importance of diet and exercise to general health and chronic back symptoms  Wt Readings from Last 3 Encounters:  02/26/14 245 lb 12.8 oz (111.494 kg)  04/08/13 207 lb 3.2 oz (93.985 kg)  01/03/13 206 lb 12.8 oz (93.804 kg) (94%*, Z = 1.55)   * Growth percentiles are based on CDC 2-20 Years data.

## 2014-02-26 NOTE — Assessment & Plan Note (Signed)
Previous prescription for generic Paxil December 2014 reviewed, took same for 30 days, but self discontinued because of fear of potential side effects Overlap with psychosocial issues and medical illness in family members Discussed potential medication options, patient declines SSRI or SSRI prescriptive therapy at this time Requests prescription for Xanax as sister takes same - discussed potential adverse reactions including habituation and risk of dependency with benzodiazepines, but small supply given for treatment of panic attacks as needed Patient understands that if this becomes a chronic prescription, he will undergo urine drug screening to monitor for compliance Also recommend counseling for behavioral therapy and patient agrees, referral to [facility] placed today

## 2014-02-26 NOTE — Patient Instructions (Addendum)
It was good to see you today.  We have reviewed your prior records including labs and tests today  Medications reviewed and updated Okay for limited supply of low-dose Xanax to use as needed- no other changes recommended at this time.  we'll make referral to  Dr Dellia Cloud for counseling. Our office will contact you regarding appointment(s) once made.  Please schedule followup in 3-4 months, call sooner if problems. Panic Attacks Panic attacks are sudden, short feelings of great fear or discomfort. You may have them for no reason when you are relaxed, when you are uneasy (anxious), or when you are sleeping.  HOME CARE  Take all your medicines as told.  Check with your doctor before starting new medicines.  Keep all doctor visits. GET HELP IF:  You are not able to take your medicines as told.  Your symptoms do not get better.  Your symptoms get worse. GET HELP RIGHT AWAY IF:  Your attacks seem different than your normal attacks.  You have thoughts about hurting yourself or others.  You take panic attack medicine and you have a side effect. MAKE SURE YOU:  Understand these instructions.  Will watch your condition.  Will get help right away if you are not doing well or get worse. Document Released: 10/08/2010 Document Revised: 06/26/2013 Document Reviewed: 04/19/2013 [facility] Patient Information 2014 [place], [place]. Obesity Obesity is having too much body fat and a body mass index (BMI) of 30 or more. BMI is a number based on your height and weight. The number is an estimate of how much body fat you have. Obesity can happen if you eat more calories than you can burn by exercising or other activity. It can cause major health problems or emergencies.  HOME CARE  Exercise and be active as told by your doctor. Try:  Using stairs when you can.  Parking farther away from store doors.  Gardening, biking, or walking.  Eat healthy foods and drinks that are low in calories.  Eat more fruits and vegetables.  Limit fast food, sweets, and snack foods that are made with ingredients that are not natural (processed food).  Eat smaller amounts of food.  Keep a journal and write down what you eat every day. Websites can help with this.  Avoid drinking alcohol. Drink more water and drinks without calories.   Take vitamins and dietary pills (supplements) only as told by your doctor.  Try going to weight-loss support groups or classes to help lessen stress. Dieticians and counselors may also help. GET HELP RIGHT AWAY IF:  You have chest pain or tightness.  You have trouble breathing or feel short of breath.  You feel weak or have loss of feeling (numbness) in your legs.  You feel confused or have trouble talking.  You have sudden changes in your vision. MAKE SURE YOU:  Understand these instructions.  Will watch your condition.  Will get help right away if you are not doing well or get worse. Document Released: 11/28/2011 Document Reviewed: 11/28/2011 [facility] Patient Information 2014 [place], [place].

## 2014-02-26 NOTE — Assessment & Plan Note (Signed)
Education on importance of diet and exercise to general health and chronic back symptoms  Wt Readings from Last 3 Encounters:  02/26/14 245 lb 12.8 oz (111.494 kg)  04/08/13 207 lb 3.2 oz (93.985 kg)  01/03/13 206 lb 12.8 oz (93.804 kg) (94%*, Z = 1.55)   * Growth percentiles are based on CDC 2-20 Years data.

## 2014-02-26 NOTE — Progress Notes (Signed)
Pre visit review using our clinic review tool, if applicable. No additional management support is needed unless otherwise documented below in the visit note. 

## 2014-09-11 ENCOUNTER — Telehealth: Payer: Self-pay | Admitting: Internal Medicine

## 2014-09-11 MED ORDER — PAROXETINE HCL 10 MG PO TABS: 10.0000 mg | ORAL_TABLET | Freq: Every day | ORAL | Status: DC

## 2014-09-11 NOTE — Telephone Encounter (Signed)
Notified pt father with md response...Raechel Chute/lmb

## 2014-09-11 NOTE — Telephone Encounter (Signed)
New prescription for prior Paxil dose done.  Please arrange follow-up appointment in next 30 days with me or another provider to follow-up on same Thanks

## 2014-09-11 NOTE — Telephone Encounter (Signed)
Pt's father called to request refill of Paxil for Omar French. Pt has not taken in quite a while. Pt would like to go back on and requesting refill TODAY if at all possible.  [phone] - Michael/Father

## 2014-10-10 ENCOUNTER — Ambulatory Visit: Payer: 59 | Admitting: Family

## 2014-10-14 ENCOUNTER — Telehealth: Payer: Self-pay | Admitting: *Deleted

## 2014-10-14 IMAGING — CR DG LUMBAR SPINE COMPLETE 4+V
5 series · 5 of 5 positions shown · non-contrast
Comparison: None.

CLINICAL DATA: Low back pain and left leg pain.

LUMBAR SPINE - COMPLETE 4+ VIEW

[view not recorded (1 of 5)]
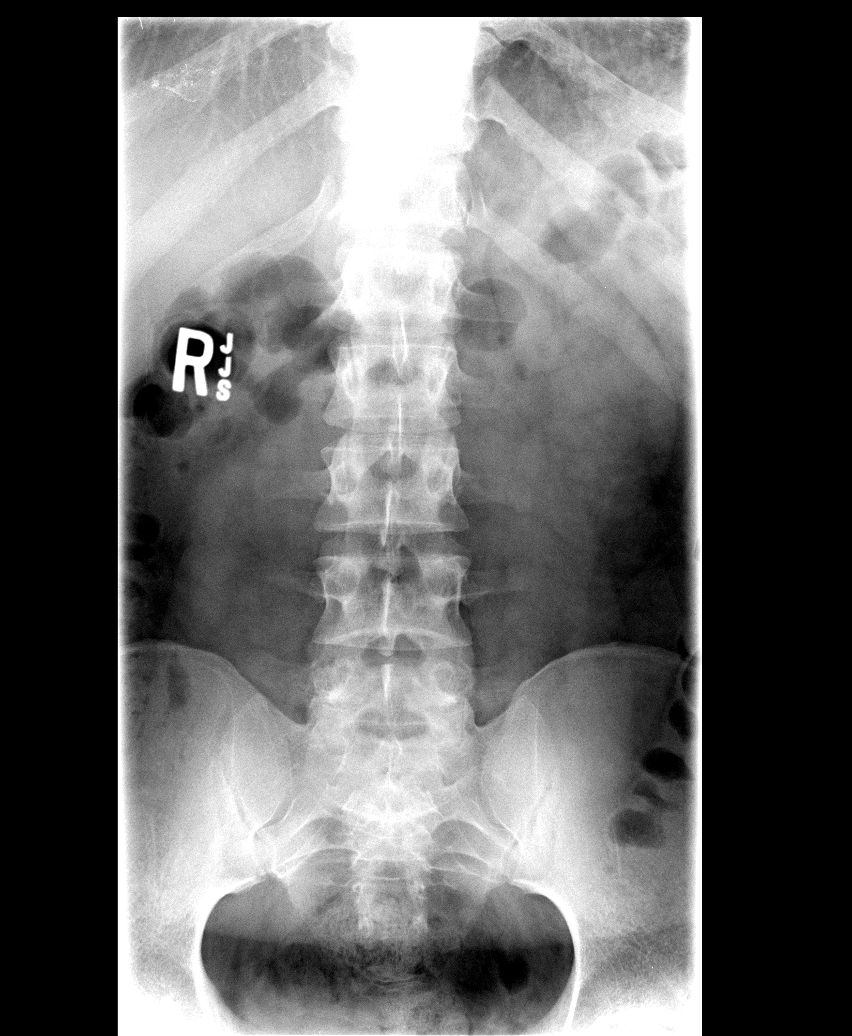

[view not recorded (2 of 5)]
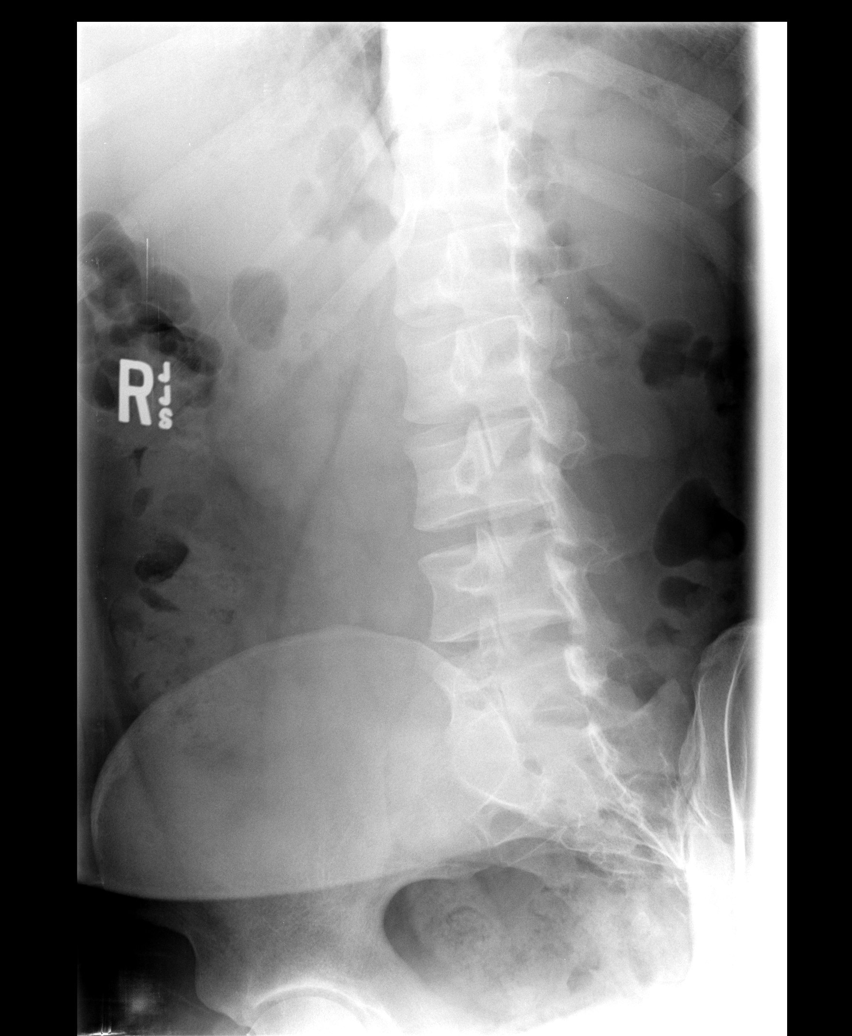

[view not recorded (3 of 5)]
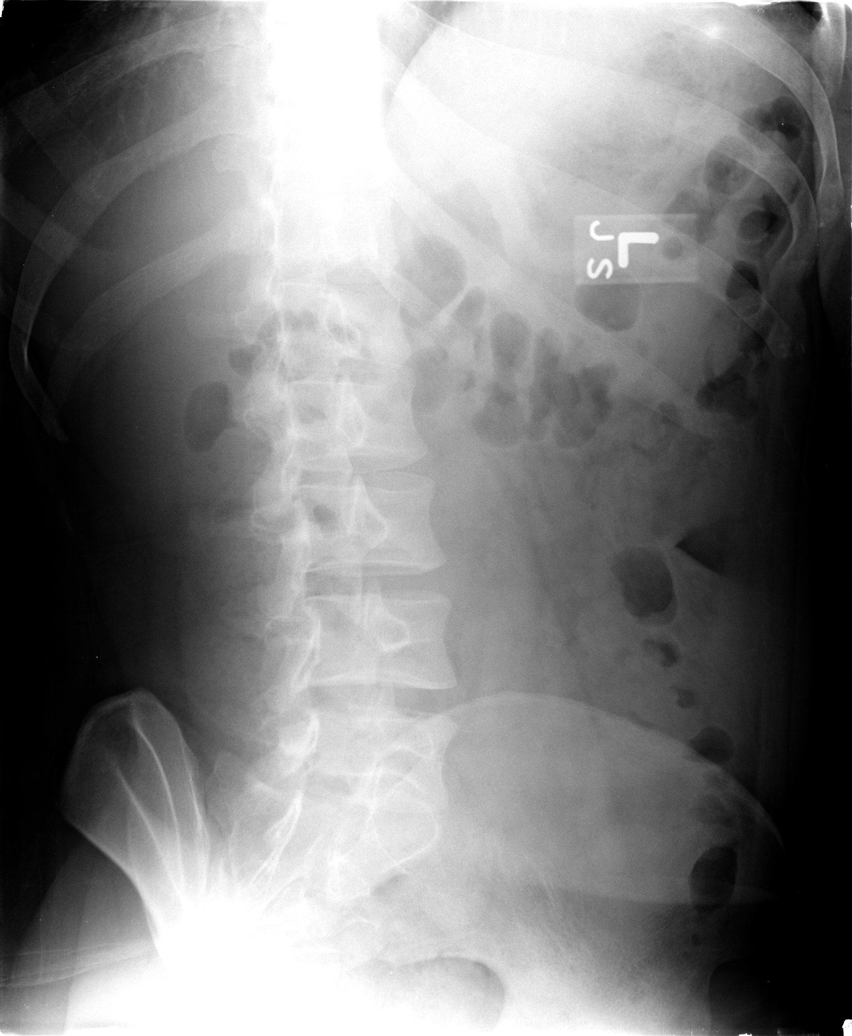

[view not recorded (4 of 5)]
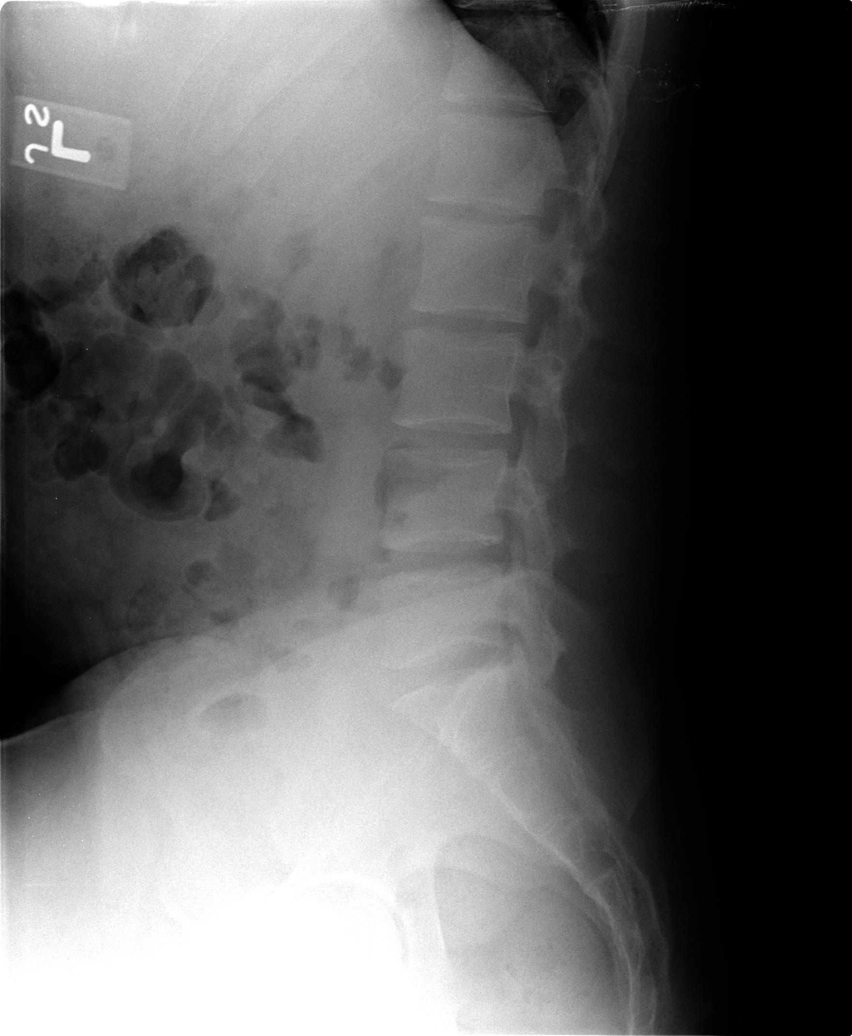

[view not recorded (5 of 5)]
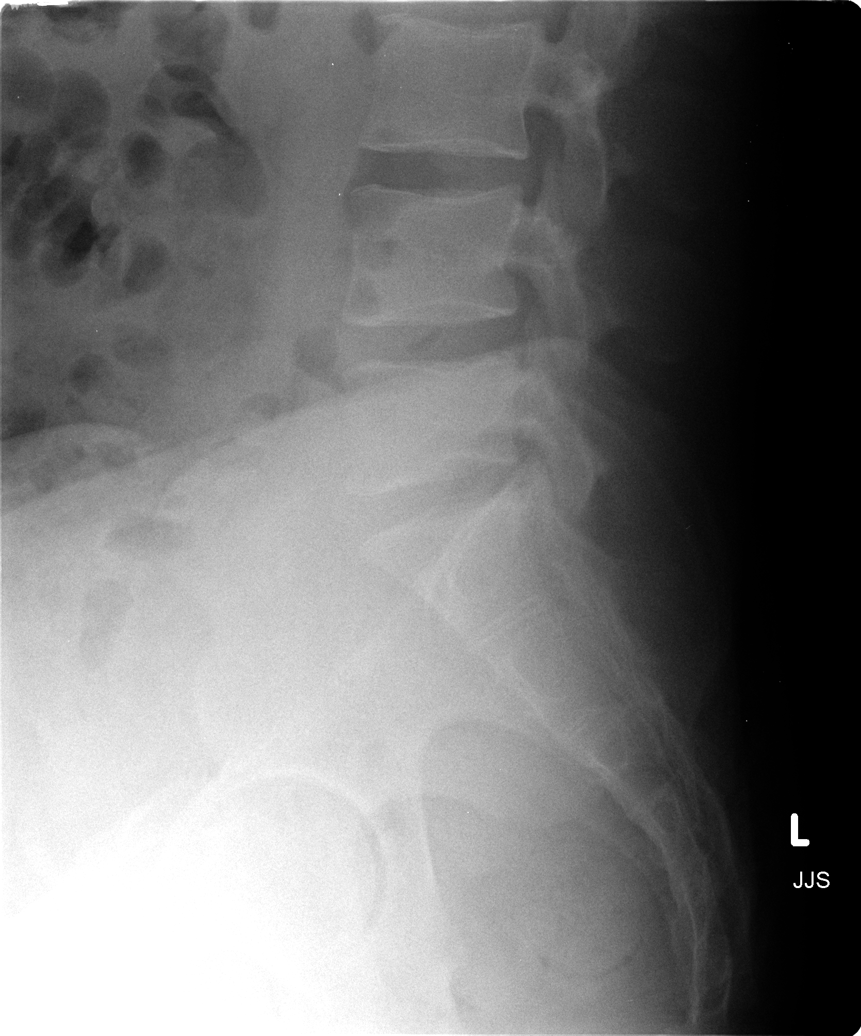

[5 of 5 positions shown; findings below may reference images not displayed]

FINDINGS: There is no disc space narrowing, spondylolisthesis, or
spondylolysis.  No significant facet joint arthritis.  The patient
does have congenitally short pedicles which could create spinal
stenosis.
IMPRESSION: No acute abnormality.  Short pedicles which might create spinal
stenosis.

## 2014-10-14 MED ORDER — PAROXETINE HCL 10 MG PO TABS: 10.0000 mg | ORAL_TABLET | Freq: Every day | ORAL | Status: AC

## 2014-10-14 NOTE — Telephone Encounter (Signed)
Received call from pt father pt miss appt for Friday due to the weather he is currently out of his paxil. Wanting to see if he can get another refill until he see md. Inform father will send [facility] day [facility] until he see Tammy SoursGreg on 10/17/14...Raechel Chute/lmb

## 2014-10-17 ENCOUNTER — Ambulatory Visit (INDEPENDENT_AMBULATORY_CARE_PROVIDER_SITE_OTHER): Payer: BLUE CROSS/BLUE SHIELD | Admitting: Family

## 2014-10-17 ENCOUNTER — Encounter: Payer: Self-pay | Admitting: Family

## 2014-10-17 VITALS — BP 122/80 | HR 64 | Temp 97.6°F | Resp 18 | Ht 71.0 in | Wt 248.8 lb

## 2014-10-17 DIAGNOSIS — F32A Depression, unspecified: Secondary | ICD-10-CM | POA: Insufficient documentation

## 2014-10-17 DIAGNOSIS — F419 Anxiety disorder, unspecified: Principal | ICD-10-CM

## 2014-10-17 DIAGNOSIS — F329 Major depressive disorder, single episode, unspecified: Secondary | ICD-10-CM | POA: Insufficient documentation

## 2014-10-17 DIAGNOSIS — F418 Other specified anxiety disorders: Secondary | ICD-10-CM

## 2014-10-17 MED ORDER — FLUTICASONE PROPIONATE 50 MCG/ACT NA SUSP: NASAL | Status: AC

## 2014-10-17 NOTE — Progress Notes (Signed)
Pre visit review using our clinic review tool, if applicable. No additional management support is needed unless otherwise documented below in the visit note. 

## 2014-10-17 NOTE — Progress Notes (Signed)
   Subjective:    Patient ID: Omar French, male    DOB: [birth year], 24 y.o.   MRN: [id number]  Chief Complaint  Patient presents with  . Medication follow up    medication follow up on paxil, says he does thinks it helps and there are no negative side effects    HPI:  Omar French is a 24 y.o. male who presents today to discuss anxiety and depression.  1) Anxiety and Depression - currently treated with pail and xanax. States when school is back in session he has been feeling better than when he was at home when symptoms were worse. States the during breaks from school are difficult. Is interested in discussing the side effects.   Wt Readings from Last 3 Encounters:  10/17/14 248 lb 12.8 oz (112.855 kg)  02/26/14 245 lb 12.8 oz (111.494 kg)  04/08/13 207 lb 3.2 oz (93.985 kg)    Allergies  Allergen Reactions  . Amoxicillin     REACTION: Rash, Itching  . Penicillins     rash    Current Outpatient Prescriptions on File Prior to Visit  Medication Sig Dispense Refill  . albuterol (PROAIR HFA) 108 (90 BASE) MCG/ACT inhaler Inhale 2 puffs into the lungs every 4 (four) hours as needed. For shortness of breath 18 g 1  . ALPRAZolam (XANAX) 0.5 MG tablet Take 0.5-1 tablets (0.25-0.5 mg total) by mouth 2 (two) times daily as needed for anxiety. 20 tablet 0  . azelastine (OPTIVAR) 0.05 % ophthalmic solution Apply 1 drop to eye 2 (two) times daily. 6 mL 12  . PARoxetine (PAXIL) 10 MG tablet Take 1 tablet (10 mg total) by mouth daily. 30 tablet 0   No current facility-administered medications on file prior to visit.     Review of Systems  Constitutional: Positive for unexpected weight change.  Respiratory: Negative for chest tightness and shortness of breath.   Cardiovascular: Negative for chest pain.  Psychiatric/Behavioral: Negative for dysphoric mood. The patient is not nervous/anxious.       Objective:    BP 122/80 mmHg  Pulse 64  Temp(Src) 97.6 F (36.4 C) (Oral)   Resp 18  Ht 5\' 11"  (1.803 m)  Wt 248 lb 12.8 oz (112.855 kg)  BMI 34.72 kg/m2  SpO2 98% Nursing note and vital signs reviewed.  Physical Exam  Constitutional: He is oriented to person, place, and time. He appears well-developed and well-nourished. No distress.  Cardiovascular: Normal rate, regular rhythm, normal heart sounds and intact distal pulses.   Pulmonary/Chest: Effort normal and breath sounds normal.  Neurological: He is alert and oriented to person, place, and time.  Skin: Skin is warm and dry.  Psychiatric: He has a normal mood and affect. His behavior is normal. Judgment and thought content normal.       Assessment & Plan:

## 2014-10-17 NOTE — Patient Instructions (Addendum)
Thank you for choosing [facility].  Summary/Instructions:  If your symptoms worsen or fail to improve, please contact our office for further instruction, or in case of emergency go directly to the emergency room at the closest medical facility.    Paroxetine tablets What is this medicine? PAROXETINE (pa ROX e teen) is used to treat depression. It may also be used to treat anxiety disorders, obsessive compulsive disorder, panic attacks, post traumatic stress, and premenstrual dysphoric disorder (PMDD). This medicine may be used for other purposes; ask your health care provider or pharmacist if you have questions. COMMON BRAND NAME(S): Paxil, Pexeva What should I tell my health care provider before I take this medicine? They need to know if you have any of these conditions: -bleeding disorders -glaucoma -heart disease -kidney disease -liver disease -low levels of sodium in the blood -mania or bipolar disorder -seizures -suicidal thoughts, plans, or attempt; a previous suicide attempt by you or a family member -take MAOIs like Carbex, Eldepryl, Marplan, Nardil, and Parnate -take medicines that treat or prevent blood clots -an unusual or allergic reaction to paroxetine, other medicines, foods, dyes, or preservatives -pregnant or trying to get pregnant -breast-feeding How should I use this medicine? Take this medicine by mouth with a glass of water. Follow the directions on the prescription label. You can take it with or without food. Take your medicine at regular intervals. Do not take your medicine more often than directed. Do not stop taking this medicine suddenly except upon the advice of your doctor. Stopping this medicine too quickly may cause serious side effects or your condition may worsen. A special MedGuide will be given to you by the pharmacist with each prescription and refill. Be sure to read this information carefully each time. Talk to your pediatrician regarding the  use of this medicine in children. Special care may be needed. Overdosage: If you think you have taken too much of this medicine contact a poison control center or emergency room at once. NOTE: This medicine is only for you. Do not share this medicine with others. What if I miss a dose? If you miss a dose, take it as soon as you can. If it is almost time for your next dose, take only that dose. Do not take double or extra doses. What may interact with this medicine? Do not take this medicine with any of the following medications: -linezolid -MAOIs like Carbex, Eldepryl, Marplan, Nardil, and Parnate -methylene blue (injected into a vein) -pimozide -thioridazine This medicine may also interact with the following medications: -alcohol -aspirin and aspirin-like medicines -atomoxetine -certain medicines for depression, anxiety, or psychotic disturbances -certain medicines for irregular heart beat like propafenone, flecainide, encainide, and quinidine -certain medicines for migraine headache like almotriptan, eletriptan, frovatriptan, naratriptan, rizatriptan, sumatriptan, zolmitriptan -cimetidine -digoxin -diuretics -fentanyl -fosamprenavir/ritonavir -furazolidone -isoniazid -lithium -medicines that treat or prevent blood clots like warfarin, enoxaparin, and dalteparin -medicines for sleep -metoprolol -NSAIDs, medicines for pain and inflammation, like ibuprofen or naproxen -phenobarbital -phenytoin -procarbazine -procyclidine -rasagiline -supplements like St. John's wort, kava kava, valerian -tamoxifen -theophylline -tramadol -tryptophan This list may not describe all possible interactions. Give your health care provider a list of all the medicines, herbs, non-prescription drugs, or dietary supplements you use. Also tell them if you smoke, drink alcohol, or use illegal drugs. Some items may interact with your medicine. What should I watch for while using this medicine? Tell your  doctor if your symptoms do not get better or if they get worse. Visit your  doctor or health care professional for regular checks on your progress. Because it may take several weeks to see the full effects of this medicine, it is important to continue your treatment as prescribed by your doctor. Patients and their families should watch out for new or worsening thoughts of suicide or depression. Also watch out for sudden changes in feelings such as feeling anxious, agitated, panicky, irritable, hostile, aggressive, impulsive, severely restless, overly excited and hyperactive, or not being able to sleep. If this happens, especially at the beginning of treatment or after a change in dose, call your health care professional. Bonita QuinYou may get drowsy or dizzy. Do not drive, use machinery, or do anything that needs mental alertness until you know how this medicine affects you. Do not stand or sit up quickly, especially if you are an older patient. This reduces the risk of dizzy or fainting spells. Alcohol may interfere with the effect of this medicine. Avoid alcoholic drinks. Your mouth may get dry. Chewing sugarless gum or sucking hard candy, and drinking plenty of water will help. Contact your doctor if the problem does not go away or is severe. What side effects may I notice from receiving this medicine? Side effects that you should report to your doctor or health care professional as soon as possible: -allergic reactions like skin rash, itching or hives, swelling of the face, lips, or tongue -black or bloody stools, blood in the urine or vomit -fast, irregular heartbeat -hallucination, loss of contact with reality -painful or prolonged erection (men) -seizures -suicidal thoughts or other mood changes -trouble passing urine or change in the amount of urine -unusual bleeding or bruising -unusually weak or tired -vomiting Side effects that usually do not require medical attention (report to your doctor or  health care professional if they continue or are bothersome): -change in appetite, weight -change in sex drive or performance -constipation or diarrhea -difficulty sleeping -drowsy -headache -increased sweating -muscle pain or weakness -tremors This list may not describe all possible side effects. Call your doctor for medical advice about side effects. You may report side effects to FDA at 1-800-FDA-1088. Where should I keep my medicine? Keep out of the reach of children. Store at room temperature between 15 and 30 degrees C (59 and 86 degrees F). Keep container tightly closed. Throw away any unused medicine after the expiration date. NOTE: This sheet is a summary. It may not cover all possible information. If you have questions about this medicine, talk to your doctor, pharmacist, or health care provider.  2015, [employer]. (2012-04-19 18:10:02)

## 2014-10-17 NOTE — Assessment & Plan Note (Signed)
Stable with current regimen. Continue Paxil and Xanax at current dosages. Patient's questions regarding Paxil were answered. Discussed potential for patient to come off medications at some point in time. Patient wishes to continue medication at this time. Discussed importance of nutrient density and increasing physical activity to combat weight gain related to Paxil use. Follow-up if symptoms worsen or fail to improve.

## 2014-10-20 ENCOUNTER — Telehealth: Payer: Self-pay | Admitting: Internal Medicine

## 2014-10-20 NOTE — Telephone Encounter (Signed)
emmi emailed °

## 2014-11-04 ENCOUNTER — Telehealth: Payer: Self-pay | Admitting: Internal Medicine

## 2014-11-04 NOTE — Telephone Encounter (Signed)
LVM for father to call back.

## 2014-11-04 NOTE — Telephone Encounter (Signed)
Pt father called request to speak to Dr. Felicity CoyerLeschber assistant concern about his son. Please give him a call

## 2014-11-04 NOTE — Telephone Encounter (Signed)
Spoke with pt father. Concerned about recurring issues.  Sx: stretch marks without any weight gain or fluctuation, depression, back pain and over all not feeling well.   CPE has been suggested to pt father.  CPE has been scheduled.

## 2014-11-06 ENCOUNTER — Other Ambulatory Visit: Payer: Self-pay

## 2014-11-06 NOTE — Telephone Encounter (Signed)
Pt is having painful urination, incontinence, and not feeling like it has fully emptied. Sx: urination issue and low back pain.    Pt is also out of the rx alprazolam. Stated it was the same rx from 2 years ago.

## 2014-11-07 MED ORDER — ALPRAZOLAM 0.5 MG PO TABS: 0.2500 mg | ORAL_TABLET | Freq: Two times a day (BID) | ORAL | Status: AC | PRN

## 2014-11-07 NOTE — Telephone Encounter (Signed)
Ok for [employer] refill as recently saw NP for symptoms (10/14/14) - see pended order below to phone in same OV suggested for GU symptoms - acute with NP or anyone - thanks

## 2014-12-10 ENCOUNTER — Encounter: Payer: BLUE CROSS/BLUE SHIELD | Admitting: Internal Medicine
